# Patient Record
Sex: Female | Born: 1940
Health system: Southern US, Community
[De-identification: ages and names within clinical notes are randomized; demographics above are authoritative.]

## PROBLEM LIST (undated history)

## (undated) DIAGNOSIS — E7800 Pure hypercholesterolemia, unspecified: Secondary | ICD-10-CM

## (undated) DIAGNOSIS — K5790 Diverticulosis of intestine, part unspecified, without perforation or abscess without bleeding: Secondary | ICD-10-CM

## (undated) DIAGNOSIS — K635 Polyp of colon: Secondary | ICD-10-CM

## (undated) DIAGNOSIS — K219 Gastro-esophageal reflux disease without esophagitis: Secondary | ICD-10-CM

## (undated) DIAGNOSIS — E785 Hyperlipidemia, unspecified: Secondary | ICD-10-CM

## (undated) DIAGNOSIS — R011 Cardiac murmur, unspecified: Secondary | ICD-10-CM

## (undated) HISTORY — PX: TOE SURGERY: SHX1073

## (undated) HISTORY — PX: KNEE SURGERY: SHX244

## (undated) HISTORY — DX: Polyp of colon: K63.5

## (undated) HISTORY — PX: CHOLECYSTECTOMY: SHX55

## (undated) HISTORY — DX: Hyperlipidemia, unspecified: E78.5

## (undated) HISTORY — DX: Diverticulosis of intestine, part unspecified, without perforation or abscess without bleeding: K57.90

## (undated) HISTORY — PX: PARTIAL HYSTERECTOMY: SHX80

## (undated) HISTORY — PX: COLONOSCOPY: SHX174

## (undated) HISTORY — PX: ABDOMINAL HYSTERECTOMY: SHX81

---

## 2004-03-10 ENCOUNTER — Ambulatory Visit (HOSPITAL_COMMUNITY): Admission: RE | Admit: 2004-03-10 | Discharge: 2004-03-10 | Payer: Self-pay | Admitting: Internal Medicine

## 2004-06-14 ENCOUNTER — Ambulatory Visit (HOSPITAL_COMMUNITY): Admission: RE | Admit: 2004-06-14 | Discharge: 2004-06-14 | Payer: Self-pay | Admitting: Pulmonary Disease

## 2005-03-13 ENCOUNTER — Ambulatory Visit (HOSPITAL_COMMUNITY): Admission: RE | Admit: 2005-03-13 | Discharge: 2005-03-13 | Payer: Self-pay | Admitting: Internal Medicine

## 2009-10-17 ENCOUNTER — Ambulatory Visit (HOSPITAL_COMMUNITY): Admission: RE | Admit: 2009-10-17 | Discharge: 2009-10-17 | Payer: Self-pay | Admitting: Internal Medicine

## 2010-01-05 ENCOUNTER — Encounter: Payer: Self-pay | Admitting: Orthopedic Surgery

## 2010-02-03 ENCOUNTER — Encounter: Payer: Self-pay | Admitting: Orthopedic Surgery

## 2010-04-21 ENCOUNTER — Ambulatory Visit (HOSPITAL_COMMUNITY): Admission: RE | Admit: 2010-04-21 | Discharge: 2010-04-21 | Payer: Self-pay | Admitting: Internal Medicine

## 2010-12-15 LAB — COMPREHENSIVE METABOLIC PANEL
ALT: 17 U/L (ref 7–35)
Albumin: 4.6
Calcium: 9.6 mg/dL
Total Bilirubin: 0.4 mg/dL
Total Protein: 6.8 g/dL

## 2010-12-15 LAB — CBC: WBC: 8.9

## 2010-12-26 ENCOUNTER — Ambulatory Visit (HOSPITAL_COMMUNITY): Payer: Medicare Other

## 2010-12-26 ENCOUNTER — Ambulatory Visit (HOSPITAL_COMMUNITY)
Admission: RE | Admit: 2010-12-26 | Discharge: 2010-12-26 | Disposition: A | Discharge status: Home / Self Care | Payer: Medicare Other | Source: Ambulatory Visit | Attending: Internal Medicine | Admitting: Internal Medicine

## 2010-12-26 ENCOUNTER — Other Ambulatory Visit (HOSPITAL_COMMUNITY): Payer: Self-pay | Admitting: Internal Medicine

## 2010-12-26 DIAGNOSIS — M25559 Pain in unspecified hip: Secondary | ICD-10-CM | POA: Insufficient documentation

## 2011-08-22 ENCOUNTER — Ambulatory Visit (INDEPENDENT_AMBULATORY_CARE_PROVIDER_SITE_OTHER): Payer: Medicare Other | Admitting: Gastroenterology

## 2011-08-22 ENCOUNTER — Encounter: Payer: Self-pay | Admitting: Gastroenterology

## 2011-08-22 ENCOUNTER — Other Ambulatory Visit: Payer: Self-pay | Admitting: Gastroenterology

## 2011-08-22 VITALS — BP 123/75 | HR 65 | Temp 97.0°F | Ht 62.0 in | Wt 154.2 lb

## 2011-08-22 DIAGNOSIS — J069 Acute upper respiratory infection, unspecified: Secondary | ICD-10-CM

## 2011-08-22 DIAGNOSIS — Z860100 Personal history of colon polyps, unspecified: Secondary | ICD-10-CM

## 2011-08-22 DIAGNOSIS — R140 Abdominal distension (gaseous): Secondary | ICD-10-CM

## 2011-08-22 DIAGNOSIS — R1084 Generalized abdominal pain: Secondary | ICD-10-CM

## 2011-08-22 DIAGNOSIS — R143 Flatulence: Secondary | ICD-10-CM

## 2011-08-22 DIAGNOSIS — Z800 Family history of malignant neoplasm of digestive organs: Secondary | ICD-10-CM

## 2011-08-22 DIAGNOSIS — R109 Unspecified abdominal pain: Secondary | ICD-10-CM

## 2011-08-22 DIAGNOSIS — R141 Gas pain: Secondary | ICD-10-CM

## 2011-08-22 DIAGNOSIS — K219 Gastro-esophageal reflux disease without esophagitis: Secondary | ICD-10-CM

## 2011-08-22 MED ORDER — ALIGN 4 MG PO CAPS: 4.0000 mg | ORAL_CAPSULE | Freq: Every day | ORAL | Status: DC

## 2011-08-22 MED ORDER — AZITHROMYCIN 250 MG PO TABS: ORAL_TABLET | ORAL | Status: DC

## 2011-08-22 MED ORDER — PEG-KCL-NACL-NASULF-NA ASC-C 100 G PO SOLR: 1.0000 | Freq: Once | ORAL | Status: DC

## 2011-08-22 NOTE — Progress Notes (Signed)
Primary Care Physician:  Carylon Perches, MD  Primary Gastroenterologist:  Roetta Sessions, MD   Chief Complaint  Patient presents with  . Colonoscopy    HPI:  Brandy Lamb is a 70 y.o. female here to schedule colonoscopy. She gives a history of colon polyps removed more than 6 years ago by Dr. Aleene Davidson in [place], [place]. There is a questionable family history of colon cancer. She states her father had surgery for diverticulitis that she thinks that he had colon cancer. She denies constipation. Bowel movement 1-2 times daily.  Abdominal bloating all day, gets worse in evening. Can be bad without eating as well. Complains bulge in upper abdomen, in the right upper quadrant and just below the umbilicus. No melena, brbpr. Some abd pain just below umbilicus. Sometimes pain worse with meals. No n/v. No heartburn on lansoprazole. Started lansoprazole couple years ago. No dysphagia.  Current Outpatient Prescriptions  Medication Sig Dispense Refill  . cholecalciferol (VITAMIN D) 1000 UNITS tablet Take 1,000 Units by mouth daily.        Marland Kitchen co-enzyme Q-10 30 MG capsule Take 30 mg by mouth 3 (three) times daily.        Marland Kitchen estrogens, conjugated, (PREMARIN) 0.3 MG tablet Take 0.3 mg by mouth daily. Take daily for 21 days then do not take for 7 days.       . fish oil-omega-3 fatty acids 1000 MG capsule Take 2 g by mouth daily.        . lansoprazole (PREVACID) 15 MG capsule Take 15 mg by mouth daily.        . rosuvastatin (CRESTOR) 5 MG tablet Take 5 mg by mouth daily.        . vitamin B-12 (CYANOCOBALAMIN) 1000 MCG tablet Take 1,000 mcg by mouth daily.          Allergies as of 08/22/2011  . (No Known Allergies)    Past Medical History  Diagnosis Date  . Hyperlipidemia     Past Surgical History  Procedure Date  . Toe surgery   . Partial hysterectomy   . Cholecystectomy   . Colonoscopy     polyps, Dr. Aleene Davidson. ?5-6 years ago.  . Knee surgery     left    Family History  Problem Relation  Age of Onset  . GI problems Father     diverticulitis surgery, no sure if had colon cancer  . Liver disease Neg Hx   . Heart disease Mother 83    deceased    History   Social History  . Marital Status: Married    Spouse Name: N/A    Number of Children: 2  . Years of Education: N/A   Occupational History  . retired    Social History Main Topics  . Smoking status: Former Smoker -- 1.0 packs/day    Types: Cigarettes  . Smokeless tobacco: Not on file   Comment: quit about 20+ yrs  . Alcohol Use: Yes     once in a blue moon  . Drug Use: No  . Sexually Active: Not on file   Other Topics Concern  . Not on file   Social History Narrative  . No narrative on file      ROS:  General: Negative for anorexia, weight loss, fever, chills, fatigue, weakness. Eyes: Negative for vision changes.  ENT: Negative for hoarseness, difficulty swallowing , nasal congestion. CV: Negative for chest pain, angina, palpitations, dyspnea on exertion, peripheral edema.  Respiratory: Negative for dyspnea at rest,  dyspnea on exertion complains of cough and yellow sputum for one week, no wheezing.  GI: See history of present illness. GU:  Negative for dysuria, hematuria, urinary incontinence, urinary frequency, nocturnal urination.  MS: Negative for joint pain, low back pain.  Derm: Negative for rash or itching.  Neuro: Negative for weakness, abnormal sensation, seizure, frequent headaches, memory loss, confusion.  Psych: Negative for anxiety, depression, suicidal ideation, hallucinations.  Endo: Negative for unusual weight change.  Heme: Negative for bruising or bleeding. Allergy: Negative for rash or hives.    Physical Examination:  BP 123/75  Pulse 65  Temp(Src) 97 F (36.1 C) (Temporal)  Ht 5\' 2"  (1.575 m)  Wt 154 lb 3.2 oz (69.945 kg)  BMI 28.20 kg/m2   General: Well-nourished, well-developed in no acute distress.  Head: Normocephalic, atraumatic.   Eyes: Conjunctiva pink, no  icterus. Mouth: Oropharyngeal mucosa moist and pink , no lesions. Slight erythema. No exudate. Neck: Supple without thyromegaly, masses, or lymphadenopathy.  Lungs: Clear to auscultation bilaterally.  Heart: Regular rate and rhythm, no murmurs rubs or gallops.  Abdomen: Bowel sounds are normal, nontender, nondistended, no hepatosplenomegaly or masses, no abdominal bruits or hernia , no rebound or guarding.   Rectal: Defer to time of colonoscopy. Extremities: No lower extremity edema. No clubbing or deformities.  Neuro: Alert and oriented x 4 , grossly normal neurologically.  Skin: Warm and dry, no rash or jaundice.   Psych: Alert and cooperative, normal mood and affect.  Labs: 12/15/2010, white blood cell count 8900, hemoglobin 15.1, hematocrit 45.3, MCV 99.6, platelets 264,000, BUN 10, creatinine 0.81, glucose 101, total bilirubin 0.4, alkaline phosphatase 75, AST 18, ALT 17, albumin 4.6.  Imaging Studies: No results found.

## 2011-08-22 NOTE — Progress Notes (Signed)
Cc to PCP 

## 2011-08-22 NOTE — Assessment & Plan Note (Addendum)
Trial of align one daily. After colonoscopy, based on findings and ongoing symptoms, consider CT of the abdomen and pelvis for further evaluation of her bloating and abdominal pain. Dr. Jena Gauss to decide after TCS complete.

## 2011-08-22 NOTE — Assessment & Plan Note (Signed)
Well-controlled on lansoprazole. No alarm symptoms.

## 2011-08-22 NOTE — Assessment & Plan Note (Signed)
C/O productive sputum. Lungs clear. Zpak. If no better, call Dr. Ouida Sills.

## 2011-08-22 NOTE — Patient Instructions (Addendum)
We have scheduled you for a colonoscopy with Dr. Jena Gauss. Please see separate instructions. I have sent a prescription for Z-Pak to [facility] for your upper respiratory infection. Try Align one daily for abdominal bloating. I have provided you with 2 weeks of samples. You may buy over the counter. Take for six weeks total.   Bloating Bloating is the feeling of fullness in your belly. You may feel as though your pants are too tight. Often the cause of bloating is overeating, retaining fluids, or having gas in your bowel. It is also caused by swallowing air and eating foods that cause gas. Irritable bowel syndrome is one of the most common causes of bloating. Constipation is also a common cause. Sometimes more serious problems can cause bloating. SYMPTOMS Usually there is a feeling of fullness, as though your abdomen is bulged out. There may be mild discomfort.  DIAGNOSIS Usually no particular testing is necessary for most bloating. If the condition persists and seems to become worse, your caregiver may do additional testing.  TREATMENT  There is no direct treatment for bloating.   Do not put gas into the bowel. Avoid chewing gum and sucking on candy. These tend to make you swallow air. Swallowing air can also be a nervous habit. Try to avoid this.   Avoiding high residue diets will help. Eat foods with soluble fibers (examples include, root vegetables, apples, barley) and substitute dairy products with soy and rice products. This helps irritable bowel syndrome.   If constipation is the cause, then a high residue diet with more fiber will help.   Avoid carbonated beverages.   Over the counter preparations are available that help reduce gas. Your pharmacist can help you with this.  SEEK MEDICAL CARE IF:  Bloating continues and seems to be getting worse.   You notice a weight gain.   You have a weight loss but the bloating is getting worse.   You have changes in your bowel  habits or develop nausea or vomiting.  SEEK IMMEDIATE MEDICAL CARE IF:  You develop shortness of breath or swelling in your legs.   You have an increase in abdominal pain or develop chest pain.  Document Released: 08/22/2006 Document Re-Released: 11/13/2009 [facility] Patient Information 2011 [place], [place].

## 2011-08-22 NOTE — Assessment & Plan Note (Signed)
Due for surveillance colonoscopy. Also with abdominal bloating, generalized vague abdominal pain with no association with bowel movements. Questionable family history of colon cancer in her father. These details are very vague as well. Proceed with colonoscopy for surveillance purposes. Patient had numerous concerns as she recently had a sister-in-law who died after perforated colon related to colonoscopy up in [place], [place].  I have discussed the risks, alternatives, benefits with regards to but not limited to the risk of reaction to medication, bleeding, infection, perforation and the patient is agreeable to proceed. Written consent to be obtained.

## 2011-09-10 ENCOUNTER — Encounter (HOSPITAL_COMMUNITY): Payer: Self-pay | Admitting: Pharmacy Technician

## 2011-09-18 MED ORDER — SODIUM CHLORIDE 0.45 % IV SOLN
Freq: Once | INTRAVENOUS | Status: AC
  Administered 2011-09-19: 07:00:00 via INTRAVENOUS

## 2011-09-19 ENCOUNTER — Encounter (HOSPITAL_COMMUNITY)
Admission: RE | Disposition: A | Discharge status: Home / Self Care | Payer: Self-pay | Source: Ambulatory Visit | Attending: Internal Medicine

## 2011-09-19 ENCOUNTER — Encounter (HOSPITAL_COMMUNITY): Payer: Self-pay

## 2011-09-19 ENCOUNTER — Ambulatory Visit (HOSPITAL_COMMUNITY)
Admission: RE | Admit: 2011-09-19 | Discharge: 2011-09-19 | Disposition: A | Discharge status: Home / Self Care | Payer: Medicare Other | Source: Ambulatory Visit | Attending: Internal Medicine | Admitting: Internal Medicine

## 2011-09-19 DIAGNOSIS — Z86010 Personal history of colonic polyps: Secondary | ICD-10-CM

## 2011-09-19 DIAGNOSIS — R109 Unspecified abdominal pain: Secondary | ICD-10-CM | POA: Insufficient documentation

## 2011-09-19 DIAGNOSIS — Z800 Family history of malignant neoplasm of digestive organs: Secondary | ICD-10-CM

## 2011-09-19 DIAGNOSIS — K5730 Diverticulosis of large intestine without perforation or abscess without bleeding: Secondary | ICD-10-CM | POA: Insufficient documentation

## 2011-09-19 DIAGNOSIS — R140 Abdominal distension (gaseous): Secondary | ICD-10-CM

## 2011-09-19 DIAGNOSIS — K219 Gastro-esophageal reflux disease without esophagitis: Secondary | ICD-10-CM

## 2011-09-19 DIAGNOSIS — J069 Acute upper respiratory infection, unspecified: Secondary | ICD-10-CM

## 2011-09-19 DIAGNOSIS — E785 Hyperlipidemia, unspecified: Secondary | ICD-10-CM | POA: Insufficient documentation

## 2011-09-19 HISTORY — DX: Cardiac murmur, unspecified: R01.1

## 2011-09-19 HISTORY — DX: Gastro-esophageal reflux disease without esophagitis: K21.9

## 2011-09-19 HISTORY — DX: Pure hypercholesterolemia, unspecified: E78.00

## 2011-09-19 HISTORY — PX: COLONOSCOPY: SHX5424

## 2011-09-19 SURGERY — COLONOSCOPY
Anesthesia: Moderate Sedation

## 2011-09-19 MED ORDER — MIDAZOLAM HCL 5 MG/5ML IJ SOLN
INTRAMUSCULAR | Status: AC
  Filled 2011-09-19: qty 10

## 2011-09-19 MED ORDER — MEPERIDINE HCL 100 MG/ML IJ SOLN
INTRAMUSCULAR | Status: AC
  Filled 2011-09-19: qty 2

## 2011-09-19 MED ORDER — MEPERIDINE HCL 100 MG/ML IJ SOLN
INTRAMUSCULAR | Status: DC | PRN
  Administered 2011-09-19: 50 mg via INTRAVENOUS

## 2011-09-19 MED ORDER — MIDAZOLAM HCL 5 MG/5ML IJ SOLN
INTRAMUSCULAR | Status: DC | PRN
  Administered 2011-09-19: 1 mg via INTRAVENOUS
  Administered 2011-09-19: 2 mg via INTRAVENOUS

## 2011-09-19 NOTE — H&P (Signed)
  I have seen & examined the patient prior to the procedure(s) today and reviewed the history and physical/consultation.  There have been no changes.  After consideration of the risks, benefits, alternatives and imponderables, the patient has consented to the procedure(s).   

## 2011-09-21 ENCOUNTER — Other Ambulatory Visit: Payer: Self-pay | Admitting: Gastroenterology

## 2011-09-21 ENCOUNTER — Ambulatory Visit (HOSPITAL_COMMUNITY)
Admit: 2011-09-21 | Discharge: 2011-09-21 | Disposition: A | Discharge status: Home / Self Care | Payer: Medicare Other | Source: Ambulatory Visit | Attending: Internal Medicine | Admitting: Internal Medicine

## 2011-09-21 DIAGNOSIS — K5730 Diverticulosis of large intestine without perforation or abscess without bleeding: Secondary | ICD-10-CM | POA: Insufficient documentation

## 2011-09-21 DIAGNOSIS — R109 Unspecified abdominal pain: Secondary | ICD-10-CM | POA: Insufficient documentation

## 2011-09-21 DIAGNOSIS — Z9889 Other specified postprocedural states: Secondary | ICD-10-CM | POA: Insufficient documentation

## 2011-09-21 DIAGNOSIS — R1900 Intra-abdominal and pelvic swelling, mass and lump, unspecified site: Secondary | ICD-10-CM | POA: Insufficient documentation

## 2011-09-21 MED ORDER — IOHEXOL 300 MG/ML  SOLN
100.0000 mL | Freq: Once | INTRAMUSCULAR | Status: AC | PRN
  Administered 2011-09-21: 100 mL via INTRAVENOUS

## 2011-09-24 DIAGNOSIS — K5730 Diverticulosis of large intestine without perforation or abscess without bleeding: Secondary | ICD-10-CM

## 2011-09-24 DIAGNOSIS — R109 Unspecified abdominal pain: Secondary | ICD-10-CM

## 2011-09-24 DIAGNOSIS — Z86010 Personal history of colonic polyps: Secondary | ICD-10-CM

## 2011-09-25 ENCOUNTER — Encounter: Payer: Self-pay | Admitting: Internal Medicine

## 2011-09-26 ENCOUNTER — Encounter (HOSPITAL_COMMUNITY): Payer: Self-pay | Admitting: Internal Medicine

## 2011-10-04 ENCOUNTER — Telehealth: Payer: Self-pay | Admitting: Gastroenterology

## 2011-10-04 NOTE — Telephone Encounter (Signed)
Routed to RMR 

## 2011-10-04 NOTE — Telephone Encounter (Signed)
Pt called wanting her procedure and CT results- she can be reached on her cell [phone]

## 2011-10-05 ENCOUNTER — Encounter: Payer: Self-pay | Admitting: Internal Medicine

## 2011-10-05 ENCOUNTER — Telehealth: Payer: Self-pay | Admitting: Internal Medicine

## 2011-10-05 NOTE — Telephone Encounter (Signed)
Took call from from front desk- Pt stated she had an "infection in her nose and that it was from her procedure two weeks ago- I asked her to explain what she meant by an infection- pt stated her nose was red and hurting inside, but she denied any discharge and then she asked "what are you going to do about it"- I asked pt if she had seen her PCP- Brandy Lamb said she has spoken to Brandy Lamb and he told her to call us and let us "handle it"- I advised pt that I would type up a message for the nurse and Brandy Lamb- she kept saying "so that means you wont help me"- I explained to her that our new policy was to type messages and send them to the nurse but we would help her best we could- Pt still was very upset and hung up

## 2011-10-05 NOTE — OR Nursing (Signed)
Called and spoke with patient and told her that I had talked with Dr. Jena Gauss and they would be calling her in the few hours with her results. She said, "well if they don't call, I'm not worried about it. I told her that they would speak with her today.

## 2011-10-05 NOTE — Progress Notes (Signed)
See letter I have sent to patient

## 2011-10-05 NOTE — Progress Notes (Signed)
Was asked by Dr. Jena Gauss to call pt with her results of her CT and colonoscopy (SEE HIS PROGRESS NOTE OF 10/05/2011). I called and a female answered the phone . When I asked to speak to pt, he said did you just call, and I said No sir, may I speak with the pt. He said she is not here and hung up. I informed Dr. Jena Gauss of the incident.

## 2011-10-05 NOTE — Telephone Encounter (Signed)
Pt called this morning saying she had talked with her PCP and he said for her to call us to handle this situation. Pt said she had a procedure and now has an infection and what were we going to do about it. She is complaining of her nose hurting and being red. Stated it could be from the O2 she received while at hospital. I explained to patient that a phone note was taken yesterday and RMR was seeing patients this morning, but would address her concerns afterwards.  She didn't want me to have nurse call her back because she said we would not call. She said she has been waiting for 2 weeks and no one would call her. As patient started getting more frustrated I transferred call over to [place].

## 2011-10-05 NOTE — Telephone Encounter (Signed)
I have reviewed the complaint. Needs to be addressed by short stay nursing staff. I called Hollie Salk RN. She stated she will call the patient

## 2011-10-05 NOTE — OR Nursing (Signed)
Called and spoke with patient upon request from Dr. Jena Gauss. Patient states, "when I left the hospital that day, I was sneezing, and my nose was dripping." When asked what color it was dripping, she said that she didn't know. She stated, "it's so sore." I explained that her oxygen was on for a total of 30 minutes during the procedure. Patient states, that "oxygen didn't smell right or feel right when they put it on." I explained that Dr. Jena Gauss didn't deal with nose infections but I would be glad to call Dr. Alonza Smoker office and explain her situation and get her an appointment. Patient refused and stated, "Don't even bother with it. If nobody wants to do anything about it then I'll deal with it." Patient proceeded to state, that she had her procedure on November 14th and hadn't heard anything about it and had a CT scan on the 16th and still had not received those results either. I told the patient that I would call Dr.  Jena Gauss and check on those results. Patient stated, "Don't even bother with it because I don't intend on coming back to that hospital again." Explained to patient that I understood her frustration with having to wait for her results and would be more than happy to call Dr. Jena Gauss and she responded, "well, if you don't have time then don't worry about it." I told her that I would call and then would get back in touch with her. I called Dr. Luvenia Starch office and spoke with Aggie Cosier who said that Dr. Jena Gauss was seeing a patient and would have him call me back. Called patient and told her I would call her as soon as I heard back from Dr. Jena Gauss and she stated, "Well, don't worry about it because I know he's busy."  Will follow-up with patient after speaking with Dr. Jena Gauss.

## 2011-10-05 NOTE — Progress Notes (Signed)
Called pt. Female answered the phone. He asked if I had just called previously. I told him no, and I asked to speak to pt and he said " she is not here" and hung up.

## 2011-10-05 NOTE — Progress Notes (Unsigned)
Patient ID: LILIANN FILE, female   DOB: 01/07/1941, 70 y.o.   MRN: [id number] Patient has called upset about a nasal infection she states she got from hospital at the time of colonoscopy. Colonoscopy unremarkable except for diverticulosis. Because of abdominal pain she underwent a CT of the abdomen and pelvis which, again, demonstrated diverticulosis only. Please let patient know diverticulosis only on both colonoscopy and CT. No polyps. Recommended daily fiber supplement and office followup in a few weeks with extender.

## 2011-10-08 NOTE — Progress Notes (Signed)
Dr. Jena Gauss  Was made aware on Friday.

## 2011-10-09 ENCOUNTER — Encounter: Payer: Self-pay | Admitting: Internal Medicine

## 2011-10-09 ENCOUNTER — Telehealth: Payer: Self-pay

## 2011-10-09 NOTE — Telephone Encounter (Signed)
Per Soledad Gerlach, she called the pt to schedule follow up appt  Per Dr,. Rourk's recommendation in his letter dictated on 10/05/2011. Pt had questions and said she had not heard from any results. I spoke with pt and told her that Dr. Jena Gauss had a letter to her. She is not home, and doesn't know when she will be. I went over the results listed in the letter, she was hesitant to schedule appt and wanted to know what will be done for her if she comes in for OV. I told her extender will do an assessment and discuss how she has been doing, and there is no way to say what will be done til she comes in and has the assessment. She then said she will just call back when she finds time to schedule.

## 2015-02-04 ENCOUNTER — Emergency Department (HOSPITAL_COMMUNITY)
Admission: EM | Admit: 2015-02-04 | Discharge: 2015-02-04 | Disposition: A | Discharge status: Home / Self Care | Payer: Medicare Other | Attending: Emergency Medicine | Admitting: Emergency Medicine

## 2015-02-04 ENCOUNTER — Emergency Department (HOSPITAL_COMMUNITY): Payer: Medicare Other

## 2015-02-04 ENCOUNTER — Encounter (HOSPITAL_COMMUNITY): Payer: Self-pay

## 2015-02-04 DIAGNOSIS — M25531 Pain in right wrist: Secondary | ICD-10-CM | POA: Diagnosis not present

## 2015-02-04 DIAGNOSIS — M25561 Pain in right knee: Secondary | ICD-10-CM

## 2015-02-04 DIAGNOSIS — Z79899 Other long term (current) drug therapy: Secondary | ICD-10-CM | POA: Diagnosis not present

## 2015-02-04 DIAGNOSIS — S99912A Unspecified injury of left ankle, initial encounter: Secondary | ICD-10-CM | POA: Diagnosis not present

## 2015-02-04 DIAGNOSIS — W010XXA Fall on same level from slipping, tripping and stumbling without subsequent striking against object, initial encounter: Secondary | ICD-10-CM | POA: Diagnosis not present

## 2015-02-04 DIAGNOSIS — M25572 Pain in left ankle and joints of left foot: Secondary | ICD-10-CM

## 2015-02-04 DIAGNOSIS — K219 Gastro-esophageal reflux disease without esophagitis: Secondary | ICD-10-CM | POA: Insufficient documentation

## 2015-02-04 DIAGNOSIS — Y998 Other external cause status: Secondary | ICD-10-CM | POA: Diagnosis not present

## 2015-02-04 DIAGNOSIS — W19XXXA Unspecified fall, initial encounter: Secondary | ICD-10-CM

## 2015-02-04 DIAGNOSIS — S8991XA Unspecified injury of right lower leg, initial encounter: Secondary | ICD-10-CM | POA: Diagnosis not present

## 2015-02-04 DIAGNOSIS — R011 Cardiac murmur, unspecified: Secondary | ICD-10-CM | POA: Diagnosis not present

## 2015-02-04 DIAGNOSIS — Y9289 Other specified places as the place of occurrence of the external cause: Secondary | ICD-10-CM | POA: Insufficient documentation

## 2015-02-04 DIAGNOSIS — S62114A Nondisplaced fracture of triquetrum [cuneiform] bone, right wrist, initial encounter for closed fracture: Secondary | ICD-10-CM | POA: Diagnosis not present

## 2015-02-04 DIAGNOSIS — E785 Hyperlipidemia, unspecified: Secondary | ICD-10-CM | POA: Diagnosis not present

## 2015-02-04 DIAGNOSIS — Y9301 Activity, walking, marching and hiking: Secondary | ICD-10-CM | POA: Insufficient documentation

## 2015-02-04 DIAGNOSIS — Z87891 Personal history of nicotine dependence: Secondary | ICD-10-CM | POA: Insufficient documentation

## 2015-02-04 DIAGNOSIS — S6991XA Unspecified injury of right wrist, hand and finger(s), initial encounter: Secondary | ICD-10-CM | POA: Diagnosis present

## 2015-02-04 DIAGNOSIS — S62111A Displaced fracture of triquetrum [cuneiform] bone, right wrist, initial encounter for closed fracture: Secondary | ICD-10-CM

## 2015-02-04 MED ORDER — HYDROCODONE-ACETAMINOPHEN 5-325 MG PO TABS: ORAL_TABLET | ORAL | Status: DC

## 2015-02-04 NOTE — ED Provider Notes (Signed)
CSN: [id number]     Arrival date & time 02/04/15  1609 History   First MD Initiated Contact with Patient 02/04/15 1643     Chief Complaint  Patient presents with  . Fall      HPI Pt was seen at 1650. Per pt, c/o sudden onset and resolution of one episode of slip and fall that occurred this morning at 1130am. Pt states she was walking and "tripped over a strap on a bag" this morning, and fell on the floor. Pt states she has been able to ambulate since the fall.  Pt c/o right hand, wrist, knee pain and left ankle pain. Right wrist pain is concerning pt "more than the other pains" and "I hope it's not broken." Right wrist pain worsens with palpation of the area and right wrist ROM. Pt took an OTC motrin 248m tab PTA with improvement of her pain. Denies prodromal symptoms before falling. Denies LOC, no AMS, no neck or back pain, no CP/SOB, no abd pain, no N/V/D, no focal motor weakness, no tinging/numbness in extremities.    Past Medical History  Diagnosis Date  . Hyperlipidemia   . Hypercholesteremia   . Heart murmur   . GERD (gastroesophageal reflux disease)    Past Surgical History  Procedure Laterality Date  . Toe surgery      2nd toe right foot  . Partial hysterectomy    . Cholecystectomy    . Colonoscopy      polyps, Dr. SWest Carbo ?5-6 years ago.  . Knee surgery      left  . Abdominal hysterectomy    . Colonoscopy  09/19/2011    Procedure: COLONOSCOPY;  Surgeon: RDaneil Dolin MD;  [facility];  Service: Endoscopy;  Laterality: N/A;  7:30   Family History  Problem Relation Age of Onset  . GI problems Father     diverticulitis surgery, not sure but she thinks he had colon cancer  . Liver disease Neg Hx   . Anesthesia problems Neg Hx   . Hypotension Neg Hx   . Malignant hyperthermia Neg Hx   . Pseudochol deficiency Neg Hx   . Heart disease Mother 526   deceased   History  Substance Use Topics  . Smoking status: Former Smoker -- 1.00 packs/day for 10 years     Types: Cigarettes    Quit date: 09/18/2005  . Smokeless tobacco: Not on file     Comment: quit about 20+ yrs  . Alcohol Use: Yes     Comment: once in a blue moon    Review of Systems ROS: Statement: All systems negative except as marked or noted in the HPI; Constitutional: Negative for fever and chills. ; ; Eyes: Negative for eye pain, redness and discharge. ; ; ENMT: Negative for ear pain, hoarseness, nasal congestion, sinus pressure and sore throat. ; ; Cardiovascular: Negative for chest pain, palpitations, diaphoresis, dyspnea and peripheral edema. ; ; Respiratory: Negative for cough, wheezing and stridor. ; ; Gastrointestinal: Negative for nausea, vomiting, diarrhea, abdominal pain, blood in stool, hematemesis, jaundice and rectal bleeding. . ; ; Genitourinary: Negative for dysuria, flank pain and hematuria. ; ; Musculoskeletal: +right wrist, hand pain; +right knee pain; +left ankle pain. Negative for back pain and neck pain. Negative for deformity.; ; Skin: Negative for pruritus, rash, abrasions, blisters, bruising and skin lesion.; ; Neuro: Negative for headache, lightheadedness and neck stiffness. Negative for weakness, altered level of consciousness , altered mental status, extremity weakness, paresthesias, involuntary  movement, seizure and syncope.      Allergies  Levaquin  Home Medications   Prior to Admission medications   Medication Sig Start Date End Date Taking? Authorizing Provider  Calcium Carbonate-Vit D-Min (CALCIUM 1200 PO) Take 1 tablet by mouth daily.     Yes Historical Provider, MD  cholecalciferol (VITAMIN D) 1000 UNITS tablet Take 1,000 Units by mouth daily.     Yes Historical Provider, MD  Coenzyme Q10 (CO Q-10) 100 MG CAPS Take 1 capsule by mouth daily.     Yes Historical Provider, MD  fish oil-omega-3 fatty acids 1000 MG capsule Take 1 g by mouth daily.    Yes Historical Provider, MD  Garlic 1610 MG CAPS Take 1 capsule by mouth daily.     Yes Historical  Provider, MD  lansoprazole (PREVACID) 15 MG capsule Take 15 mg by mouth daily.     Yes Historical Provider, MD  Multiple Vitamin (ANTIOXIDANT A/C/E PO) Take 1 tablet by mouth daily.     Yes Historical Provider, MD  vitamin B-12 (CYANOCOBALAMIN) 1000 MCG tablet Take 1,000 mcg by mouth daily.     Yes Historical Provider, MD  peg 3350 powder (MOVIPREP) 100 G SOLR Take 1 kit (100 g total) by mouth once. As directed Please purchase 1 Fleets enema to use with the prep Patient not taking: Reported on 02/04/2015 08/22/11   Daneil Dolin, MD   BP 109/68 mmHg  Pulse 74  Temp(Src) 98.7 F (37.1 C) (Oral)  Resp 18  Ht 5' 1"  (1.549 m)  Wt 140 lb (63.504 kg)  BMI 26.47 kg/m2  SpO2 98% Physical Exam  1655: Physical examination: Vital signs and O2 SAT: Reviewed; Constitutional: Well developed, Well nourished, Well hydrated, In no acute distress; Head and Face: Normocephalic, Atraumatic; Eyes: EOMI, PERRL, No scleral icterus; ENMT: Mouth and pharynx normal, Left TM normal, Right TM normal, Mucous membranes moist; Neck: Supple, Trachea midline; Spine:  No midline CS, TS, LS tenderness.; Cardiovascular: Regular rate and rhythm, No gallop; Respiratory: Breath sounds clear & equal bilaterally, No wheezes, Normal respiratory effort/excursion; Chest: Nontender, No deformity, Movement normal, No crepitus, No abrasions or ecchymosis.; Abdomen: Soft, Nontender, Nondistended, Normal bowel sounds, No abrasions or ecchymosis.; Genitourinary: No CVA tenderness;; Extremities: No deformity, +right wrist with generalized TTP. No snuffbox tenderness. Forearm compartments soft, strong radial pp, brisk cap refill in fingers. Hand NMS intact.  Decreased ROM F/E right wrist d/t c/o pain. Right hand TTP along 3rd MCP. No open wounds, no ecchymosis, no edema, no erythema, no deformity. NT right shoulder/elbow/fingers. NT left ankle. NMS left foot, strong pedal pp, LE muscle compartments soft.  No left proximal fibular head tenderness, no  knee tenderness, no foot tenderness.  No deformity, no ecchymosis, no erythema, no edema, no open wounds.  +plantarflexion of leftt foot w/calf squeeze.  No palpable gap left Achilles's tendon. +FROM right knee, including able to lift extended RLE against gravity, and extend right lower leg against resistance.  No ligamentous laxity.  No patellar or quad tendon step-offs.  NMS intact right foot, strong pedal pp. +plantarflexion of right foot w/calf squeeze.  No palpable gap right Achilles's tendon.  No proximal fibular head tenderness.  No edema, erythema, warmth, ecchymosis or deformity. NT right knee/ankle/foot. Otherwise, full range of motion major/large joints of bilat UE's and LE's without pain or tenderness to palp, Neurovascularly intact, Pulses normal, Pelvis stable; Neuro: AA&Ox3, GCS 15.  Major CN grossly intact. Speech clear. No gross focal motor or sensory deficits in  extremities. Climbs on and off chair in exam room easily by herself. Gait steady.; Skin: Color normal, Warm, Dry    ED Course  Procedures     EKG Interpretation None      MDM  MDM Reviewed: previous chart, nursing note and vitals Interpretation: x-ray      Dg Wrist Complete Right 02/04/2015   CLINICAL DATA:  Pain after tripping and fall today  EXAM: RIGHT WRIST - COMPLETE 3+ VIEW  COMPARISON:  None.  FINDINGS: There is mild irregularity at the dorsal aspect of the triquetrum which may represent a nondisplaced acute fracture. There are no other areas of suspicion for acute fracture. There is no dislocation. There is mild arthritis at the radial aspect of the wrist. There is no radiopaque foreign body.  IMPRESSION: Probable nondisplaced fracture at the dorsal aspect of the triquetrum.   Electronically Signed   By: Andreas Newport M.D.   On: 02/04/2015 17:49   Dg Ankle Complete Left 02/04/2015   CLINICAL DATA:  Lateral ankle pain after tripping over a bag strap today  EXAM: LEFT ANKLE COMPLETE - 3+ VIEW  COMPARISON:   None.  FINDINGS: There is no evidence of fracture, dislocation, or joint effusion. There is no evidence of arthropathy or other focal bone abnormality. Soft tissues are unremarkable.  IMPRESSION: Negative.   Electronically Signed   By: Andreas Newport M.D.   On: 02/04/2015 17:42   Dg Knee Complete 4 Views Right 02/04/2015   CLINICAL DATA:  Lateral pain after tripping over a bag strap today  EXAM: RIGHT KNEE - COMPLETE 4+ VIEW  COMPARISON:  None.  FINDINGS: There is no evidence of fracture, dislocation, or joint effusion. There is mild degenerative narrowing of the medial compartment. Soft tissues are unremarkable.  IMPRESSION: No acute bony pathology.   Electronically Signed   By: Andreas Newport M.D.   On: 02/04/2015 17:47   Dg Hand Complete Right 02/04/2015   CLINICAL DATA:  Tripped over bag today. Right hand injury and pain. Initial encounter.  EXAM: RIGHT HAND - COMPLETE 3+ VIEW  COMPARISON:  None.  FINDINGS: There is no evidence of fracture or dislocation. Mild osteoarthritis is seen involving interphalangeal joint of thumb. No other significant bone abnormality identified.  IMPRESSION: No acute findings.   Electronically Signed   By: Earle Gell M.D.   On: 02/04/2015 17:51    1800:  Splint right wrist, sling, f/u Ortho MD. Pt wants to go home now. Dx and testing d/w pt.  Questions answered.  Verb understanding, agreeable to d/c home with outpt f/u.   Francine Graven, DO 02/07/15 2035

## 2015-02-04 NOTE — ED Notes (Signed)
C/o rt wrist pain, rt knee and left ankle pain after falling over a strap on a bag today. Denies hitting head.

## 2015-02-04 NOTE — Discharge Instructions (Signed)
°Emergency Department Resource Guide °1) Find a Doctor and Pay Out of Pocket °Although you won't have to find out who is covered by your insurance plan, it is a good idea to ask around and get recommendations. You will then need to call the office and see if the doctor you have chosen will accept you as a new patient and what types of options they offer for patients who are self-pay. Some doctors offer discounts or will set up payment plans for their patients who do not have insurance, but you will need to ask so you aren't surprised when you get to your appointment. ° °2) Contact Your Local Health Department °Not all health departments have doctors that can see patients for sick visits, but many do, so it is worth a call to see if yours does. If you don't know where your local health department is, you can check in your phone book. The CDC also has a tool to help you locate your state's health department, and many state websites also have listings of all of their local health departments. ° °3) Find a Walk-in Clinic °If your illness is not likely to be very severe or complicated, you may want to try a walk in clinic. These are popping up all over the country in pharmacies, drugstores, and shopping centers. They're usually staffed by nurse practitioners or physician assistants that have been trained to treat common illnesses and complaints. They're usually fairly quick and inexpensive. However, if you have serious medical issues or chronic medical problems, these are probably not your best option. ° °No Primary Care Doctor: °- Call [facility] Connect at  832-8000 - they can help you locate a primary care doctor that  accepts your insurance, provides certain services, etc. °- Physician Referral Service- 1-800-533-3463 ° °Chronic Pain Problems: °Organization         Address  Phone   Notes  °[facility]  (336) 297-2271 Patients need to be referred by their primary care doctor.  ° °Medication  Assistance: °Organization         Address  Phone   Notes  °[place] [facility] [address] °[place], [place] [address] (336) 641-8030 --Must be a resident of [place] °-- Must have NO insurance coverage whatsoever (no Medicaid/ Medicare, etc.) °-- The pt. MUST have a primary care doctor that directs their care regularly and follows them in the community °  °[facility]  (866) 331-1348   °[employer]  (888) 892-1162   ° °Agencies that provide inexpensive medical care: °Organization         Address  Phone   Notes  °[facility]  (336) 832-8035   °[facility]    (336) 832-7272   °[facility] [address] °[place], [place] [address] (336) 832-4777   °[facility] [address], °[place] (336) 271-4999   °[facility]    (336) 373-0678   °[facility]    (336) 272-1050   °[facility] ° [address], [place] Phone:  (336) 832-4444, Fax:  (336) 832-4440 Hours of Operation:  9 am - 6 pm, M-F.  Also accepts Medicaid/Medicare and self-pay.  °[facility] ° [address], [place] Phone: (336) 832-3150, Fax: (336) [phone]. Hours of Operation:  8:30 am - 5:30 pm, M-F.  Also accepts Medicaid and self-pay.  °[facility] [place] [address], [place] Phone: (336) 878-6027   °[facility] [address], [place], [place] (336)723-1848, Ext. 123 Mondays & Thursdays: 7-9 AM.  First 15 patients are seen on a first come, first serve basis. °  ° °Medicaid-accepting [place] Providers: ° °Organization         Address  Phone   Notes  °[facility] [address], [place] (336) 641-2100 Also accepts self-pay patients.  °[facility] [address], [place] ° (336) 856-9996   °[facility] [address], [place]  (336) 288-8857   °[facility] [address] [place] [address], [place] (336) 299-7000   °Veita Bland [address], [place]  ° (336) 373-1557 Only accepts [place] Access Medicaid patients after they have their name applied to their card.  ° °Self-Pay (no insurance) in [place]: ° °Organization         Address  Phone   Notes  °Sickle Cell Patients, [facility] [address], [place] (336) 832-1970   °[facility] [address], [place] (336) 832-4400   °[facility] ° [address], [place] (336) 992-4800   °[facility]/Dr. Osei-Bonsu ° [address] [place] [address], [place] or [address], [place] (336) 841-8500 Phone number for both [place] and [place] locations is the same.  °[facility] [address], [place] (336) 299-0000   °[facility] [address] [place] [address], [place] or [address] (336) 852-7530 °(336) 878-2260   °[facility] [address], [place] (336) 350-1642, phone; (336) 294-5005, fax Sees patients 1st and 3rd Saturday of every month.  Must not qualify for public or private insurance (i.e. Medicaid, Medicare, [place] Health Choice, Veterans' Benefits) • Household income should be no more than 200% of the poverty level •The clinic cannot treat you if you are pregnant or think you are pregnant • Sexually transmitted diseases are not treated at the clinic.  ° ° °Dental Care: °Organization         Address  Phone  Notes  °[place] [facility] [address], [place] (336) 641-6152 Accepts children up to age 21 who are enrolled in Medicaid or [place] Health Choice; pregnant women with a Medicaid card; and children who have applied for Medicaid or [place] Health Choice, but were declined, whose parents can pay a reduced fee at time of service.  °[place]  [facility] [place]  [address], [place] (336) 641-7733 Accepts children up to age 21 who are enrolled in Medicaid or [place] Health Choice; pregnant women with a Medicaid card; and children who have applied for Medicaid or [place] Health Choice, but were declined, whose parents can pay a reduced fee at time of service.  °[facility] ° [address], [place] (336) 641-4533 Patients are seen by appointment only. Walk-ins are not accepted. [facility] will see patients 18 years of age and older. °Monday - Tuesday (8am-5pm) °Most Wednesdays (8:30-5pm) °$30 per visit, cash only  °[facility] ° [address], [place] (336) 641-4533 Patients are seen by appointment only. Walk-ins are not accepted. [facility] will see patients 18 years of age and older. °One   Wednesday Evening (Monthly: Volunteer Based).  $30 per visit, cash only  °[facility]  (919) 537-3737 for adults; Children under age 4, call Graduate Pediatric Dentistry at (919) [phone]. Children aged 4-14, please call ([phone]) [phone] to request a pediatric application. ° Dental services are provided in all areas of dental care including fillings, crowns and bridges, complete and partial dentures, implants, gum treatment, root canals, and extractions. Preventive care is also provided. Treatment is provided to both adults and children. °Patients are selected via a lottery and there is often a waiting list. °  °[facility] [address], °[place] ° (336) 763-8833 [email or web address] °  °[facility] [address], [place], [place] (336)723-1848, Ext. 123 Second and Fourth Thursday of each month, opens at 6:30 AM; Clinic ends at 9 AM.  Patients are seen on a first-come first-served basis, and a limited number are seen during each clinic.  ° °[facility] ° [address], [place], [place] (336) 723-7904    Eligibility Requirements °You must have lived in [place], [place], or [place] counties for at least the last three months. °  You cannot be eligible for state or federal sponsored healthcare insurance, including Veterans Administration, Medicaid, or Medicare. °  You generally cannot be eligible for healthcare insurance through your employer.  °  How to apply: °Eligibility screenings are held every Tuesday and Wednesday afternoon from 1:00 pm until 4:00 pm. You do not need an appointment for the interview!  °[facility] [address], [place], [place] 336-631-2330   °[facility]  336-342-8273   °[facility]  336-703-3100   °[facility]  336-570-6415   ° °Behavioral Health Resources in the Community: °Intensive Outpatient Programs °Organization         Address  Phone  Notes  °[facility] [address], [place], [place] 336-878-6098   °[facility] [address], [place], [place] 336-832-9800   °[facility] [address], [place], [place] ° 336-882-2125   °[facility] [address],  °[place], [place] 1-800-853-5163 or 336-641-4981   °Substance Abuse Resources °Organization         Address  Phone  Notes  °[facility]  336-882-2125   °[facility]  336-784-9470   °[facility]  336-285-9073   °[facility]  336-845-3988   °Residential & Outpatient Substance Abuse Program  1-800-659-3381   °Psychological Services °Organization         Address  Phone  Notes  °[facility]  336- 832-9600   °[facility]  336- 378-7881   °[facility] [address], [place] 1-800-853-5163 or 336-641-4981   ° °Mobile Crisis Teams °Organization         Address  Phone  Notes  °[facility], Mobile Crisis Care Unit  1-877-626-1772   °Assertive °[facility] ° [address]  [place], [place] 336-834-9664   °Sharon DeEsch [address] °[place] 336-554-5454   ° °Self-Help/Support Groups °Organization         Address  Phone             Notes  °[facility] of  - variety of support groups  336- 373-1402 Call for more information  °Narcotics Anonymous (NA), [facility] [address], °[place]  2 meetings at this location  ° °  Residential Treatment Programs Organization         Address  Phone  Notes  [facility] [address],    [place]  [phone]   [facility]  [address], [place] [phone], [place], [place] [phone]   [facility] [address], [place] [phone] Admissions: 8am-3pm M-F  [facility] [address]    [place], [place] [phone]   [facility] [address], [place], [place] [phone]   [facility] [address]  [place], [place] [phone]   [facility] Intensive Outpatient [address], [place], [place] [phone]   [facility] ([facility] [address]  [place], [place] [phone] or [phone]   [facility] [address] [place], [place] [phone] Accepts Medicaid  [facility] [address]  [place] [phone] Substance Abuse/Addiction Treatment   [facility] Resources Organization         Address  Phone  Notes  [facility]  239-025-4363(888) [phone]   Angie FavaJulie Brannon, PhD [address], [address] [place], [place]   (630) [phone](336) ([phone] or ([phone](336) [phone]   [facility]   [address] [place], [place] [phone]) [phone]   [facility] [address], [place], [place] [phone](336) [phone] Insurance/Medicaid/sponsorship through [facility]  [facility] [address]                                    [place], [place] [phone]) [phone] Therapy/tele-psych/case    [facility] [address]   [place], [place] [phone]) [phone]    Dr. Lolly MustacheArfeen  [phone]) [phone]   [facility]  [employer] [facility] 1) [address], [place] 2) [address], [place] 3)  [address], [place] [phone](336) [phone](336) [phone]  [phone](336) [phone]   [facility] Child Abuse Hotline [phone](336) [phone] or [phone](336) [phone] (After Hours)      Take the prescription as directed.  If this prescription pain medication is too sedating, take over the counter tylenol and ibuprofen, as directed on packaging, as needed for discomfort. Do not take extra tylenol when you take the prescription medication because it already contains tylenol. Apply moist heat or ice to the area(s) of discomfort, for 15 minutes at a time, several times per day for the next few days.  Do not fall asleep on a heating or ice pack. Wear the splint and use the sling for comfort until you are seen in follow up by the Orthopedic doctor. Elevate your wrist at much as possible. Call the Orthopedic doctor on Monday to schedule a follow up appointment next week.  Return to the Emergency Department immediately if worsening.

## 2015-10-14 ENCOUNTER — Ambulatory Visit (INDEPENDENT_AMBULATORY_CARE_PROVIDER_SITE_OTHER): Payer: Medicare Other | Admitting: Podiatry

## 2015-10-14 ENCOUNTER — Ambulatory Visit (INDEPENDENT_AMBULATORY_CARE_PROVIDER_SITE_OTHER): Payer: Medicare Other

## 2015-10-14 VITALS — BP 135/69 | HR 64 | Resp 16 | Ht 61.0 in | Wt 142.0 lb

## 2015-10-14 DIAGNOSIS — M79672 Pain in left foot: Secondary | ICD-10-CM

## 2015-10-14 DIAGNOSIS — M79671 Pain in right foot: Secondary | ICD-10-CM

## 2015-10-14 DIAGNOSIS — M722 Plantar fascial fibromatosis: Secondary | ICD-10-CM | POA: Diagnosis not present

## 2015-10-14 MED ORDER — TRIAMCINOLONE ACETONIDE 10 MG/ML IJ SUSP
10.0000 mg | Freq: Once | INTRAMUSCULAR | Status: AC
  Administered 2015-10-14: 10 mg

## 2015-10-14 NOTE — Patient Instructions (Signed)

## 2015-10-14 NOTE — Progress Notes (Signed)
   Subjective:    Patient ID: Brandy Lamb, female    DOB: 10/26/1941, 74 y.o.   MRN: [id number]  HPI Patient presents with foot pain in their left foot, heel. Pt stated, "feels like there is a knot in heel"; x3 weeks.   Review of Systems  HENT: Positive for sinus pressure.   Eyes: Positive for visual disturbance.  Endocrine: Positive for cold intolerance.  All other systems reviewed and are negative.      Objective:   Physical Exam        Assessment & Plan:

## 2015-10-16 NOTE — Progress Notes (Signed)
Subjective:     Patient ID: Brandy EnsignLinda B Vinzant, female   DOB: 10/21/1941, 74 y.o.   MRN: [id number]  HPI patient states I feel like there is a not the bottom of my left foot and it's been hurting a while and worse over the last 3 weeks   Review of Systems  All other systems reviewed and are negative.      Objective:   Physical Exam  Constitutional: She is oriented to person, place, and time.  Cardiovascular: Intact distal pulses.   Musculoskeletal: Normal range of motion.  Neurological: She is oriented to person, place, and time.  Skin: Skin is warm.  Nursing note and vitals reviewed.  neurovascular status intact muscle strength adequate with inflammation plantar aspect left heel with what appears to be a small bursal sac in the center portion the heel that's painful when pressed. Good range of motion was noted patient has good digital perfusion with mild depression of the arch     Assessment:     Inflammatory fasciitis with possibility for plantar bursal sac    Plan:     H&P x-rays reviewed and today I injected the plantar fascial left 3 mg Kenalog 5 mg Xylocaine and dispensed fascial brace with instructions on usage. Reappoint to reevaluate

## 2015-10-18 ENCOUNTER — Telehealth: Payer: Self-pay | Admitting: *Deleted

## 2015-10-18 MED ORDER — MELOXICAM 15 MG PO TABS: 15.0000 mg | ORAL_TABLET | Freq: Every day | ORAL | Status: AC

## 2015-10-18 NOTE — Telephone Encounter (Signed)
Pt states seen in office 12/09'/2016 and the Meloxicam has not been called to her pharmacy.  Dr. Charlsie Merlesegal states meloxicam 15mg  #30 1 tablet every day,+2refills. Orders to pt and [facility].

## 2015-11-03 ENCOUNTER — Ambulatory Visit: Payer: Medicare Other | Admitting: Podiatry

## 2015-11-14 ENCOUNTER — Ambulatory Visit: Payer: Medicare Other | Admitting: Podiatry

## 2016-02-01 ENCOUNTER — Ambulatory Visit (INDEPENDENT_AMBULATORY_CARE_PROVIDER_SITE_OTHER): Payer: Medicare Other | Admitting: Podiatry

## 2016-02-01 DIAGNOSIS — M722 Plantar fascial fibromatosis: Secondary | ICD-10-CM | POA: Diagnosis not present

## 2016-02-01 MED ORDER — TRIAMCINOLONE ACETONIDE 10 MG/ML IJ SUSP
10.0000 mg | Freq: Once | INTRAMUSCULAR | Status: AC
  Administered 2016-02-01: 10 mg

## 2016-02-01 NOTE — Progress Notes (Signed)
Subjective:     Patient ID: Brandy EnsignLinda B Lamb, female   DOB: 10/24/1941, 75 y.o.   MRN: [id number]  HPI patient states my heel has started to bother me quite a bit again and my husband's in the hospital saw him on my foot a lot   Review of Systems     Objective:   Physical Exam Neurovascular status intact muscle strength adequate with discomfort in the plantar heel left at the insertional point tendon into the calcaneus with fluid buildup noted    Assessment:     Continue plantar fasciitis left    Plan:     Reinjected the plantar fascia left 3 mg Kenalog 5 mill grams Xylocaine and discussed long-term orthotics which patient will have made and will be seen back as needed

## 2016-02-14 ENCOUNTER — Other Ambulatory Visit (HOSPITAL_COMMUNITY): Payer: Self-pay | Admitting: Internal Medicine

## 2016-02-14 DIAGNOSIS — Z780 Asymptomatic menopausal state: Secondary | ICD-10-CM

## 2016-02-15 ENCOUNTER — Ambulatory Visit (HOSPITAL_COMMUNITY)
Admission: RE | Admit: 2016-02-15 | Discharge: 2016-02-15 | Disposition: A | Discharge status: Home / Self Care | Payer: Medicare Other | Source: Ambulatory Visit | Attending: Internal Medicine | Admitting: Internal Medicine

## 2016-02-15 DIAGNOSIS — M85852 Other specified disorders of bone density and structure, left thigh: Secondary | ICD-10-CM | POA: Diagnosis not present

## 2016-02-15 DIAGNOSIS — M810 Age-related osteoporosis without current pathological fracture: Secondary | ICD-10-CM | POA: Diagnosis not present

## 2016-02-15 DIAGNOSIS — Z780 Asymptomatic menopausal state: Secondary | ICD-10-CM | POA: Diagnosis present

## 2016-05-25 ENCOUNTER — Telehealth: Payer: Self-pay | Admitting: *Deleted

## 2016-05-25 NOTE — Telephone Encounter (Signed)
Pt states her foot still hurts the shot doesn't help, she wants therapy. I told pt she had not had a continued series of treatment whether it was injections or not, and part of treatment was to be consistent and follow up with the doctor. Last appt 01/2016.  I told pt there were other treatments available, certainly PT could be discussed with Dr. Charlsie Merlesegal.  I transferred to schedulers.

## 2016-05-31 ENCOUNTER — Ambulatory Visit (INDEPENDENT_AMBULATORY_CARE_PROVIDER_SITE_OTHER): Payer: Medicare Other | Admitting: Podiatry

## 2016-05-31 ENCOUNTER — Ambulatory Visit (INDEPENDENT_AMBULATORY_CARE_PROVIDER_SITE_OTHER): Payer: Medicare Other

## 2016-05-31 ENCOUNTER — Encounter: Payer: Self-pay | Admitting: Podiatry

## 2016-05-31 VITALS — BP 121/58 | HR 58

## 2016-05-31 DIAGNOSIS — M722 Plantar fascial fibromatosis: Secondary | ICD-10-CM

## 2016-05-31 DIAGNOSIS — M79672 Pain in left foot: Secondary | ICD-10-CM

## 2016-05-31 NOTE — Progress Notes (Signed)
Subjective:     Patient ID: Brandy Lamb, female   DOB: [birth year], 75 y.o.   MRN: [id number]  HPI patient states the left heel is still really bothering her a lot and now she's getting pain on top of her foot from walking differently and this is been going on for close two-year   Review of Systems     Objective:   Physical Exam Neurovascular status intact muscle strength adequate and continued discomfort left plantar fashion medial band with exquisite discomfort when palpated and also noted to have moderate discomfort in the dorsum of the left foot localized in nature    Assessment:     Continued acute plantar fasciitis left with compensatory pain in the midfoot and top of the left foot    Plan:     Reviewed condition and recommended long-term shockwave explaining this to her. She wants to try physical therapy first which I think is reasonable and today I went ahead and I did place her in an air fracture walker to immobilize due to the new areas of discomfort and to help with the shockwave when performed

## 2016-05-31 NOTE — Patient Instructions (Signed)

## 2016-06-01 ENCOUNTER — Telehealth: Payer: Self-pay

## 2016-06-01 DIAGNOSIS — M722 Plantar fascial fibromatosis: Secondary | ICD-10-CM

## 2016-06-01 NOTE — Telephone Encounter (Signed)
Pt called to give information on the PT office near her home that she would prefer to go to  [facility]: [phone], fax [phone]

## 2016-06-07 NOTE — Telephone Encounter (Signed)
Pt states she has an appt at [facility] on 06/12/2016, needs orders. Dr. Charlsie Merles states PT to left heel for plantar fasciitis, focus on stretching and ROM.

## 2016-07-12 ENCOUNTER — Ambulatory Visit: Payer: Medicare Other | Admitting: Podiatry

## 2016-09-17 ENCOUNTER — Encounter: Payer: Self-pay | Admitting: Internal Medicine

## 2017-03-20 ENCOUNTER — Encounter (HOSPITAL_COMMUNITY): Payer: Medicare Other

## 2017-03-28 IMAGING — DX DG WRIST COMPLETE 3+V*R*
4 series · 4 of 4 positions shown · non-contrast
Comparison: None.

CLINICAL DATA: Pain after tripping and fall today

EXAM:
RIGHT WRIST - COMPLETE 3+ VIEW

[wrist pa]
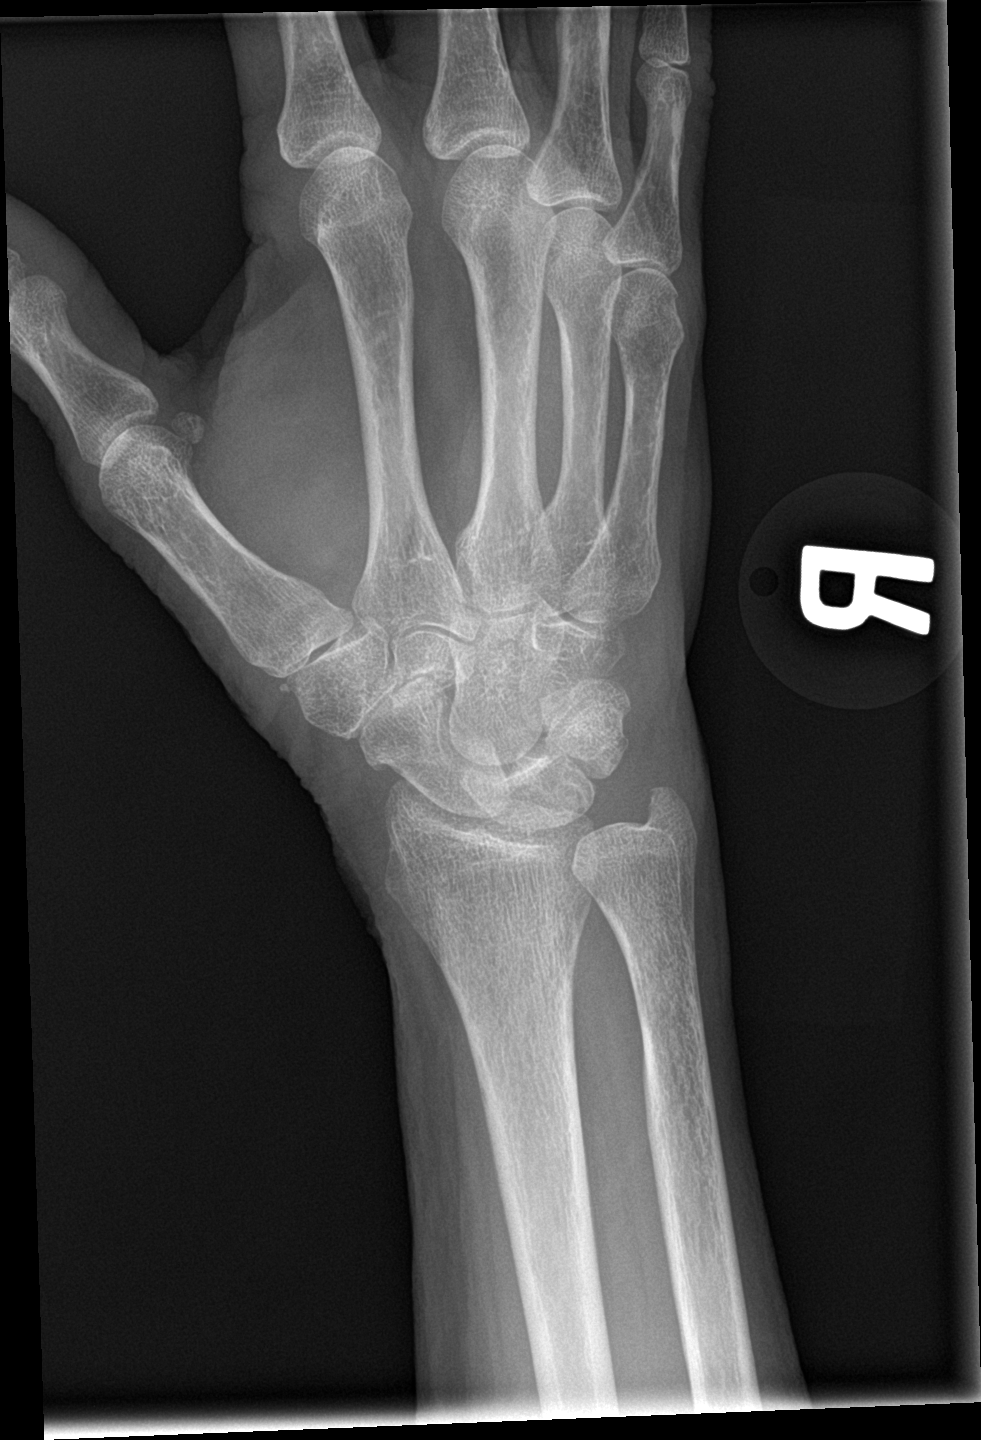

[wrist obl]
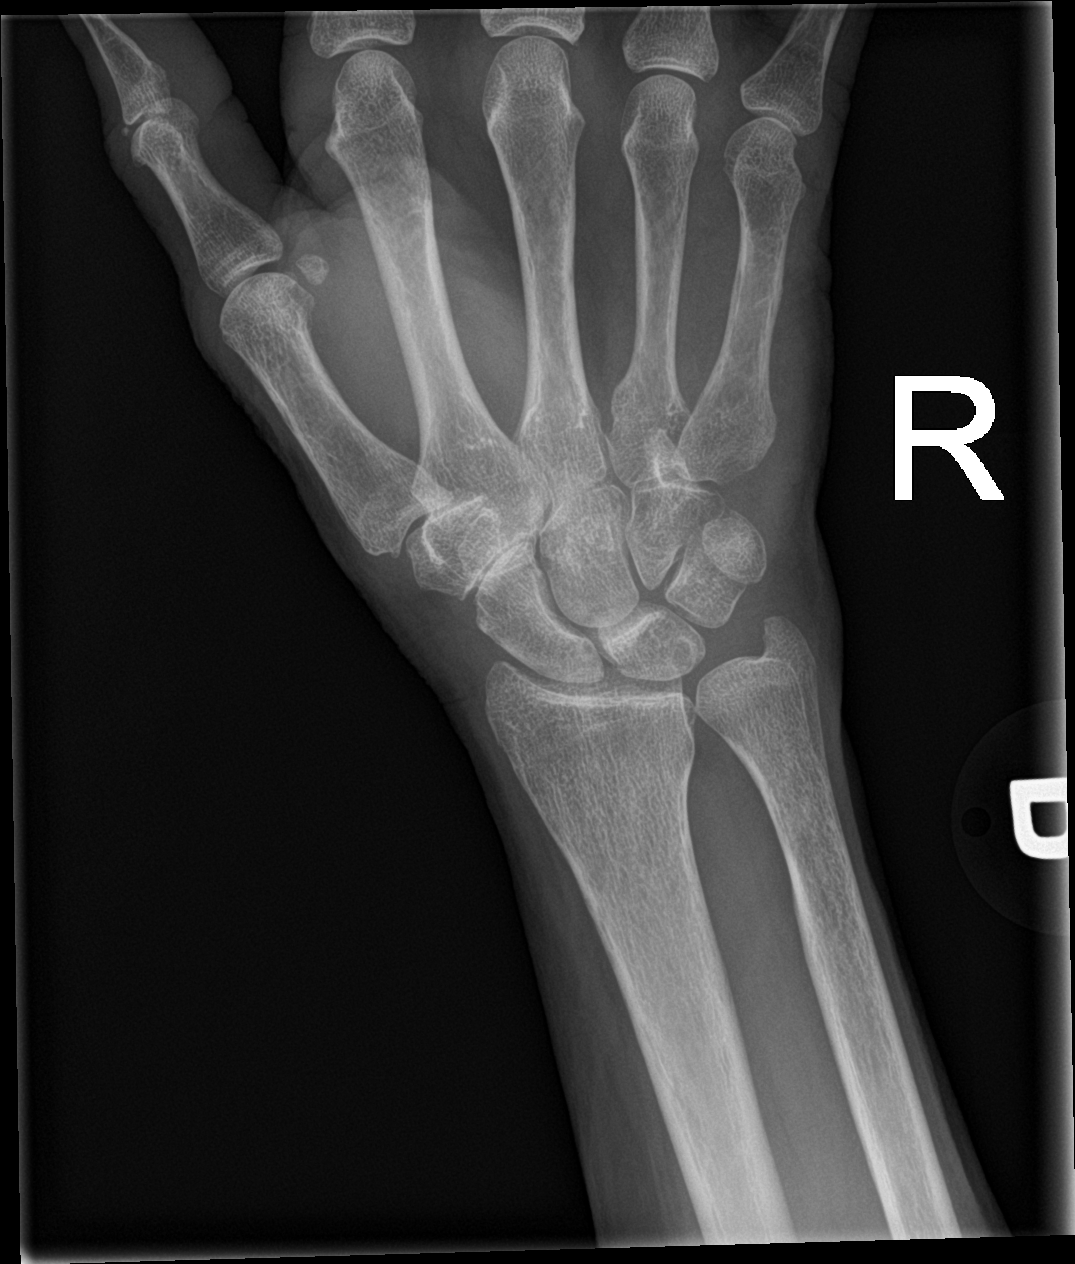

[wrist lat]
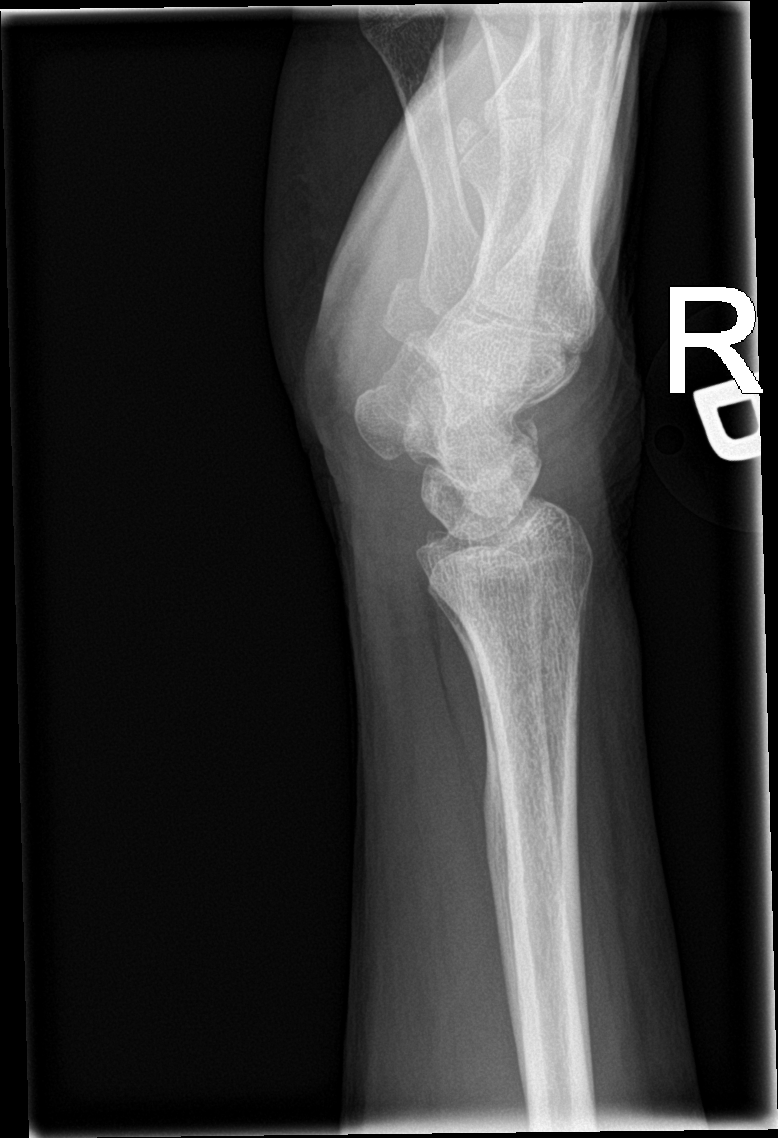

[wrist navicular]
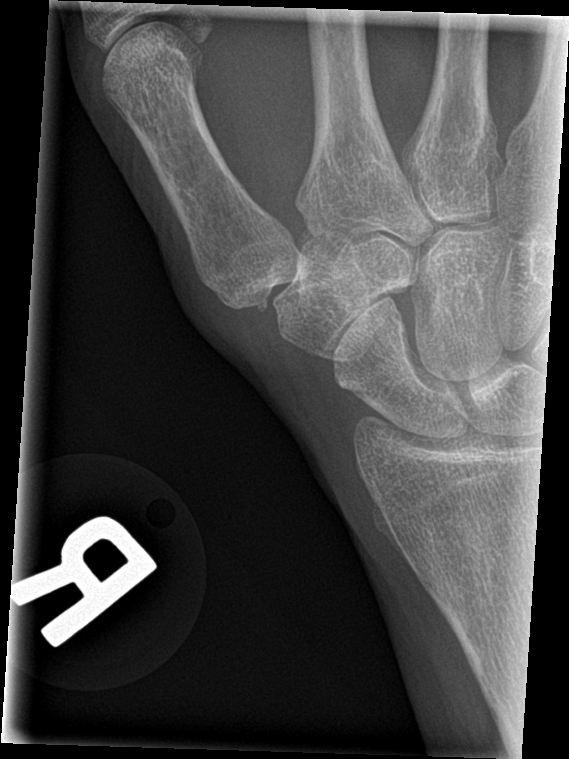

[4 of 4 positions shown; findings below may reference images not displayed]

FINDINGS: There is mild irregularity at the dorsal aspect of the triquetrum
which may represent a nondisplaced acute fracture. There are no
other areas of suspicion for acute fracture. There is no
dislocation. There is mild arthritis at the radial aspect of the
wrist. There is no radiopaque foreign body.
IMPRESSION: Probable nondisplaced fracture at the dorsal aspect of the
triquetrum.

## 2017-03-28 IMAGING — DX DG ANKLE COMPLETE 3+V*L*
3 series · 3 of 3 positions shown · non-contrast
Comparison: None.

CLINICAL DATA: Lateral ankle pain after tripping over a bag strap
today

EXAM:
LEFT ANKLE COMPLETE - 3+ VIEW

[ankle ap]
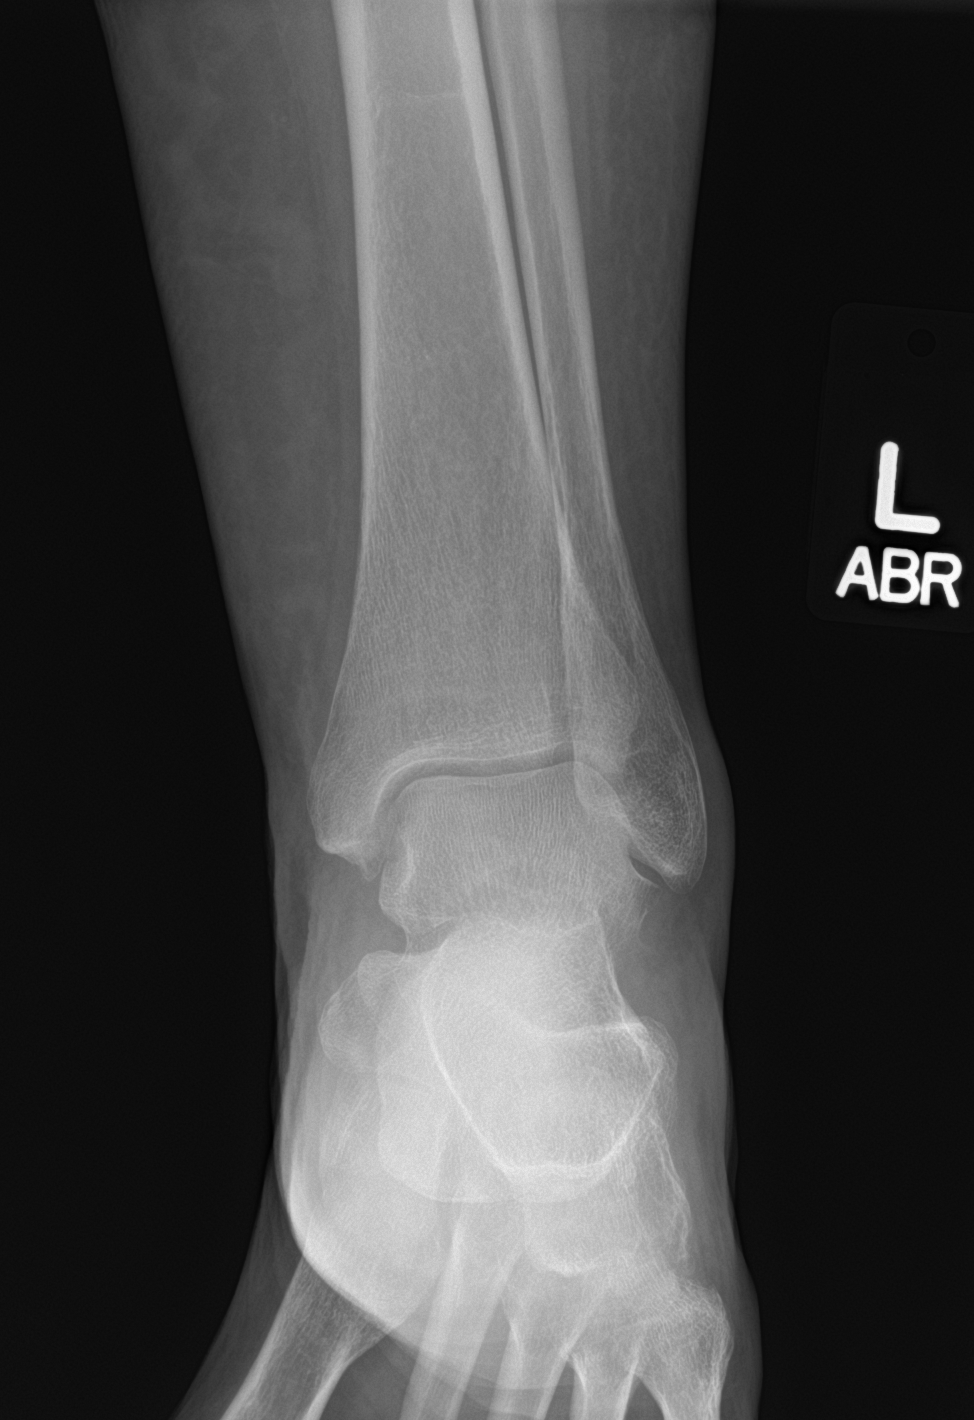

[ankle obl]
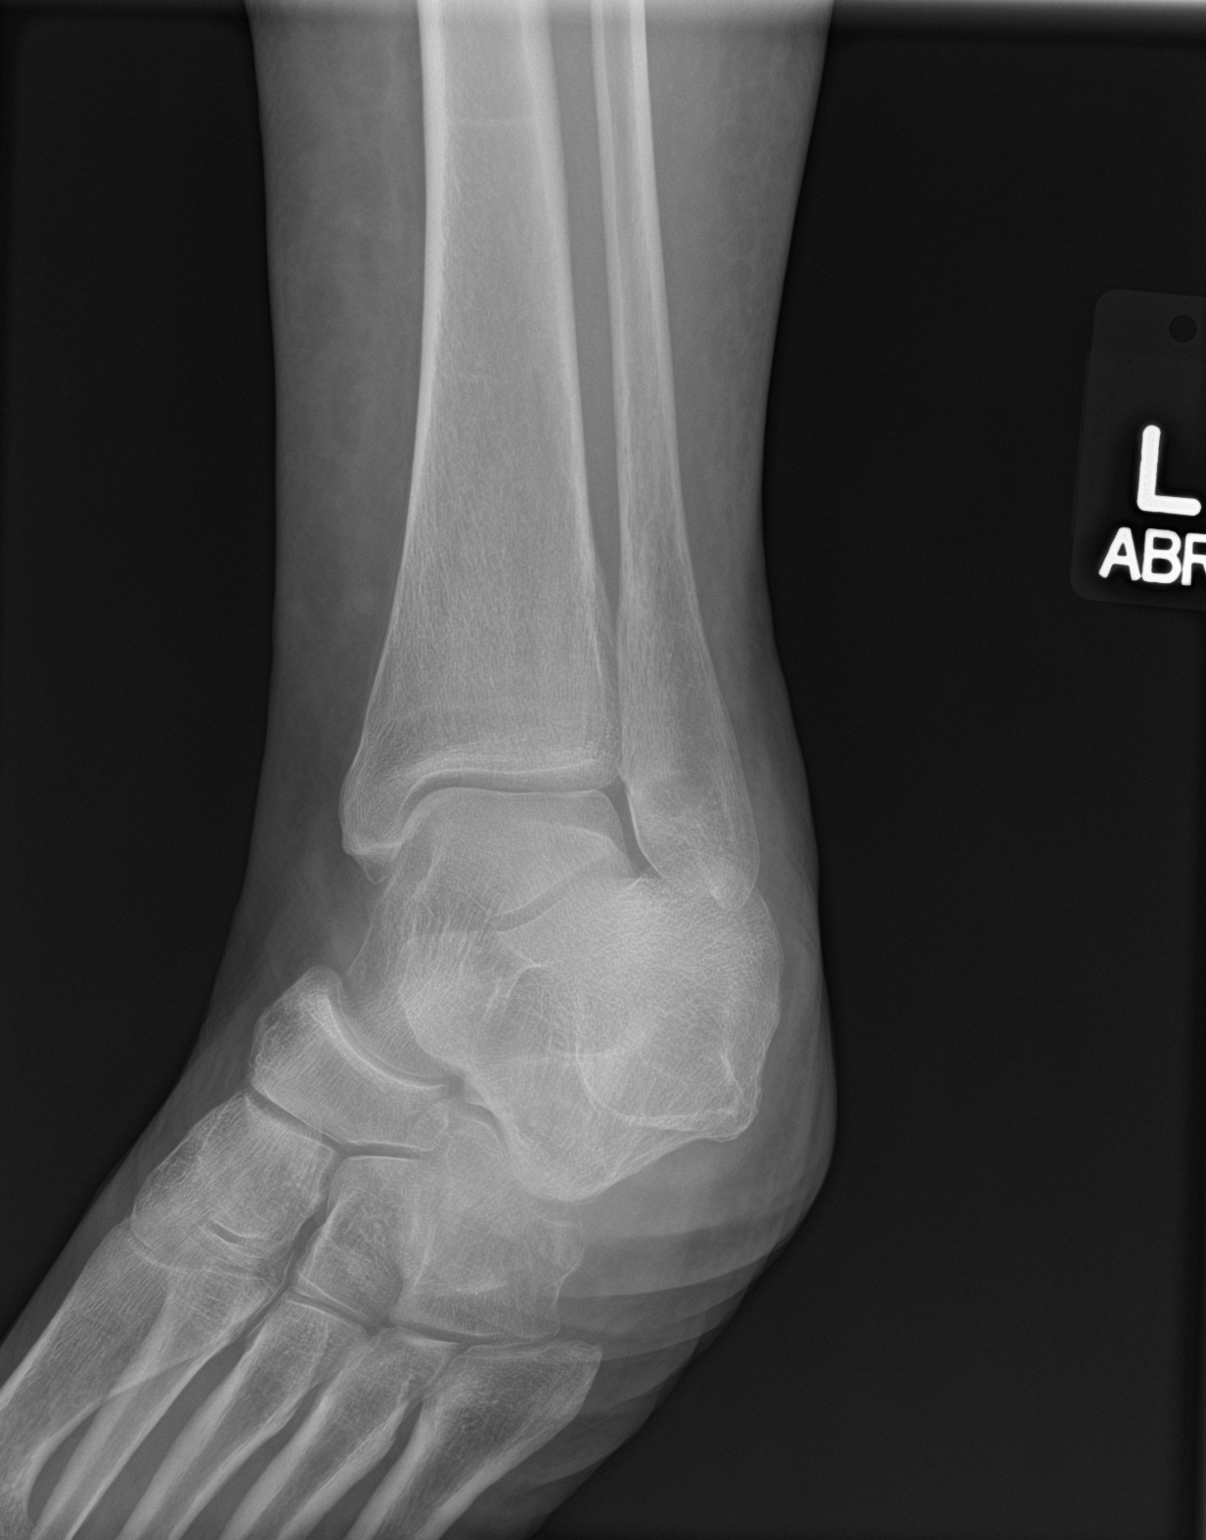

[ankle lat]
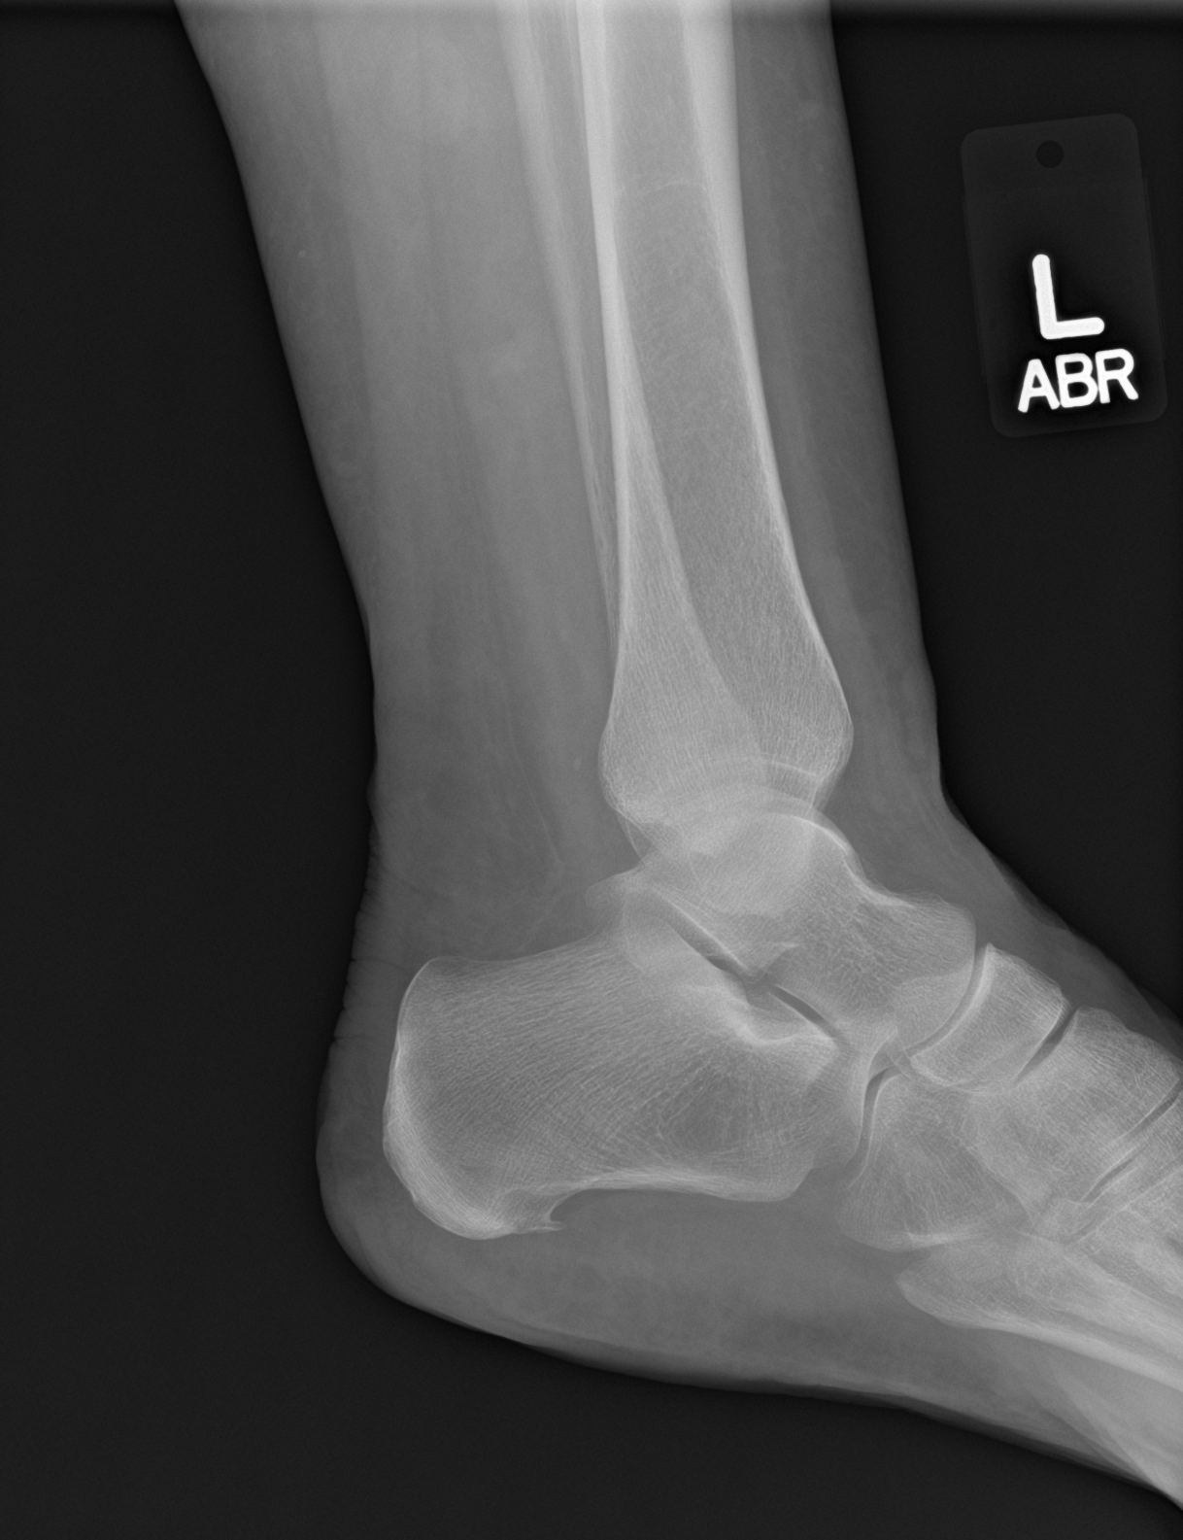

[3 of 3 positions shown; findings below may reference images not displayed]

FINDINGS: There is no evidence of fracture, dislocation, or joint effusion.
There is no evidence of arthropathy or other focal bone abnormality.
Soft tissues are unremarkable.
IMPRESSION: Negative.

## 2017-08-14 ENCOUNTER — Ambulatory Visit: Payer: Medicare Other | Admitting: Internal Medicine

## 2018-12-25 DIAGNOSIS — H2513 Age-related nuclear cataract, bilateral: Secondary | ICD-10-CM | POA: Diagnosis not present

## 2019-03-02 DIAGNOSIS — M549 Dorsalgia, unspecified: Secondary | ICD-10-CM | POA: Diagnosis not present

## 2019-03-05 DIAGNOSIS — M546 Pain in thoracic spine: Secondary | ICD-10-CM | POA: Diagnosis not present

## 2019-03-05 DIAGNOSIS — M25512 Pain in left shoulder: Secondary | ICD-10-CM | POA: Diagnosis not present

## 2019-03-10 DIAGNOSIS — M25512 Pain in left shoulder: Secondary | ICD-10-CM | POA: Diagnosis not present

## 2019-03-10 DIAGNOSIS — M546 Pain in thoracic spine: Secondary | ICD-10-CM | POA: Diagnosis not present

## 2019-03-12 DIAGNOSIS — M546 Pain in thoracic spine: Secondary | ICD-10-CM | POA: Diagnosis not present

## 2019-03-12 DIAGNOSIS — M25512 Pain in left shoulder: Secondary | ICD-10-CM | POA: Diagnosis not present

## 2019-03-17 DIAGNOSIS — M546 Pain in thoracic spine: Secondary | ICD-10-CM | POA: Diagnosis not present

## 2019-03-17 DIAGNOSIS — M25512 Pain in left shoulder: Secondary | ICD-10-CM | POA: Diagnosis not present

## 2019-03-19 DIAGNOSIS — M546 Pain in thoracic spine: Secondary | ICD-10-CM | POA: Diagnosis not present

## 2019-03-19 DIAGNOSIS — M25512 Pain in left shoulder: Secondary | ICD-10-CM | POA: Diagnosis not present

## 2019-03-24 DIAGNOSIS — M25512 Pain in left shoulder: Secondary | ICD-10-CM | POA: Diagnosis not present

## 2019-03-24 DIAGNOSIS — M546 Pain in thoracic spine: Secondary | ICD-10-CM | POA: Diagnosis not present

## 2019-03-26 DIAGNOSIS — M546 Pain in thoracic spine: Secondary | ICD-10-CM | POA: Diagnosis not present

## 2019-03-26 DIAGNOSIS — M25512 Pain in left shoulder: Secondary | ICD-10-CM | POA: Diagnosis not present

## 2019-03-31 DIAGNOSIS — M25512 Pain in left shoulder: Secondary | ICD-10-CM | POA: Diagnosis not present

## 2019-03-31 DIAGNOSIS — M546 Pain in thoracic spine: Secondary | ICD-10-CM | POA: Diagnosis not present

## 2019-04-06 DIAGNOSIS — M810 Age-related osteoporosis without current pathological fracture: Secondary | ICD-10-CM | POA: Diagnosis not present

## 2019-04-06 DIAGNOSIS — I700 Atherosclerosis of aorta: Secondary | ICD-10-CM | POA: Diagnosis not present

## 2019-04-06 DIAGNOSIS — E785 Hyperlipidemia, unspecified: Secondary | ICD-10-CM | POA: Diagnosis not present

## 2019-04-06 DIAGNOSIS — Z79899 Other long term (current) drug therapy: Secondary | ICD-10-CM | POA: Diagnosis not present

## 2019-04-06 DIAGNOSIS — K219 Gastro-esophageal reflux disease without esophagitis: Secondary | ICD-10-CM | POA: Diagnosis not present

## 2019-04-07 DIAGNOSIS — M25512 Pain in left shoulder: Secondary | ICD-10-CM | POA: Diagnosis not present

## 2019-04-07 DIAGNOSIS — M546 Pain in thoracic spine: Secondary | ICD-10-CM | POA: Diagnosis not present

## 2019-04-13 DIAGNOSIS — E785 Hyperlipidemia, unspecified: Secondary | ICD-10-CM | POA: Diagnosis not present

## 2019-04-13 DIAGNOSIS — I700 Atherosclerosis of aorta: Secondary | ICD-10-CM | POA: Diagnosis not present

## 2019-04-13 DIAGNOSIS — M810 Age-related osteoporosis without current pathological fracture: Secondary | ICD-10-CM | POA: Diagnosis not present

## 2019-04-21 DIAGNOSIS — M25512 Pain in left shoulder: Secondary | ICD-10-CM | POA: Diagnosis not present

## 2019-04-21 DIAGNOSIS — M546 Pain in thoracic spine: Secondary | ICD-10-CM | POA: Diagnosis not present

## 2019-07-12 DIAGNOSIS — H6120 Impacted cerumen, unspecified ear: Secondary | ICD-10-CM | POA: Diagnosis not present

## 2019-10-05 DIAGNOSIS — R6889 Other general symptoms and signs: Secondary | ICD-10-CM | POA: Diagnosis not present

## 2019-10-06 DIAGNOSIS — Z1231 Encounter for screening mammogram for malignant neoplasm of breast: Secondary | ICD-10-CM | POA: Diagnosis not present

## 2019-11-30 DIAGNOSIS — R300 Dysuria: Secondary | ICD-10-CM | POA: Diagnosis not present

## 2019-12-30 DIAGNOSIS — R450 Nervousness: Secondary | ICD-10-CM | POA: Diagnosis not present

## 2019-12-31 DIAGNOSIS — R42 Dizziness and giddiness: Secondary | ICD-10-CM | POA: Diagnosis not present

## 2019-12-31 DIAGNOSIS — R531 Weakness: Secondary | ICD-10-CM | POA: Diagnosis not present

## 2020-01-01 DIAGNOSIS — R531 Weakness: Secondary | ICD-10-CM | POA: Diagnosis not present

## 2020-01-05 DIAGNOSIS — I491 Atrial premature depolarization: Secondary | ICD-10-CM | POA: Diagnosis not present

## 2020-01-05 DIAGNOSIS — R42 Dizziness and giddiness: Secondary | ICD-10-CM | POA: Diagnosis not present

## 2020-01-14 DIAGNOSIS — H16213 Exposure keratoconjunctivitis, bilateral: Secondary | ICD-10-CM | POA: Diagnosis not present

## 2020-01-26 DIAGNOSIS — R42 Dizziness and giddiness: Secondary | ICD-10-CM | POA: Diagnosis not present

## 2020-01-29 ENCOUNTER — Other Ambulatory Visit: Payer: Self-pay

## 2020-01-29 ENCOUNTER — Emergency Department
Admission: EM | Admit: 2020-01-29 | Discharge: 2020-01-29 | Disposition: A | Discharge status: Home / Self Care | Payer: Medicare Other | Attending: Emergency Medicine | Admitting: Emergency Medicine

## 2020-01-29 ENCOUNTER — Emergency Department: Payer: Medicare Other

## 2020-01-29 DIAGNOSIS — Z79899 Other long term (current) drug therapy: Secondary | ICD-10-CM | POA: Diagnosis not present

## 2020-01-29 DIAGNOSIS — R42 Dizziness and giddiness: Secondary | ICD-10-CM | POA: Diagnosis not present

## 2020-01-29 DIAGNOSIS — H532 Diplopia: Secondary | ICD-10-CM | POA: Diagnosis not present

## 2020-01-29 DIAGNOSIS — J0101 Acute recurrent maxillary sinusitis: Secondary | ICD-10-CM

## 2020-01-29 DIAGNOSIS — Z87891 Personal history of nicotine dependence: Secondary | ICD-10-CM | POA: Insufficient documentation

## 2020-01-29 DIAGNOSIS — J0100 Acute maxillary sinusitis, unspecified: Secondary | ICD-10-CM | POA: Diagnosis not present

## 2020-01-29 LAB — CBC
HCT: 42.8 % (ref 36.0–46.0)
Hemoglobin: 14.6 g/dL (ref 12.0–15.0)
MCH: 33.5 pg (ref 26.0–34.0)
MCHC: 34.1 g/dL (ref 30.0–36.0)
MCV: 98.2 fL (ref 80.0–100.0)
Platelets: 286 10*3/uL (ref 150–400)
RBC: 4.36 MIL/uL (ref 3.87–5.11)
RDW: 12 % (ref 11.5–15.5)
WBC: 8.8 10*3/uL (ref 4.0–10.5)
nRBC: 0 % (ref 0.0–0.2)

## 2020-01-29 LAB — URINALYSIS, COMPLETE (UACMP) WITH MICROSCOPIC
Bacteria, UA: NONE SEEN
Bilirubin Urine: NEGATIVE
Glucose, UA: NEGATIVE mg/dL
Hgb urine dipstick: NEGATIVE
Ketones, ur: NEGATIVE mg/dL
Nitrite: NEGATIVE
Protein, ur: NEGATIVE mg/dL
Specific Gravity, Urine: 1.008 (ref 1.005–1.030)
pH: 7 (ref 5.0–8.0)

## 2020-01-29 LAB — BASIC METABOLIC PANEL
Anion gap: 8 (ref 5–15)
BUN: 12 mg/dL (ref 8–23)
CO2: 27 mmol/L (ref 22–32)
Calcium: 9.7 mg/dL (ref 8.9–10.3)
Chloride: 107 mmol/L (ref 98–111)
Creatinine, Ser: 0.85 mg/dL (ref 0.44–1.00)
GFR calc Af Amer: >60 mL/min (ref >60)
GFR calc non Af Amer: >60 mL/min (ref >60)
Glucose, Bld: 101 mg/dL — ABNORMAL HIGH (ref 70–99)
Potassium: 4.7 mmol/L (ref 3.5–5.1)
Sodium: 142 mmol/L (ref 135–145)

## 2020-01-29 LAB — TROPONIN I (HIGH SENSITIVITY): Troponin I (High Sensitivity): <2 ng/L (ref <18)

## 2020-01-29 MED ORDER — FLUTICASONE PROPIONATE 50 MCG/ACT NA SUSP: 2.0000 | Freq: Every day | NASAL | 0 refills | Status: AC

## 2020-01-29 MED ORDER — AMOXICILLIN-POT CLAVULANATE 875-125 MG PO TABS: 1.0000 | ORAL_TABLET | Freq: Two times a day (BID) | ORAL | 0 refills | Status: AC

## 2020-01-29 NOTE — ED Notes (Signed)
Pt returned from MRI °

## 2020-01-29 NOTE — ED Triage Notes (Signed)
Pt states that she has been having dizziness for the past week, pt states that it doesn't matter what she does or position she is in she has the dizziness, pt states that her dr recommended a mri, but pt said she really didn't want to from a bad past experience, but states that she will do whatever needs to be done to determine the reason for the persistent dizziness, denies weakness to either side of body, states that her legs feel weak bilat due to the way she is having to compensate for walking with the dizziness

## 2020-01-29 NOTE — ED Notes (Signed)
Pt transported to MRI 

## 2020-01-29 NOTE — ED Provider Notes (Signed)
[facility] Emergency Department Provider Note  ____________________________________________   First MD Initiated Contact with Patient 01/29/20 1230     (approximate)  I have reviewed the triage vital signs and the nursing notes.   HISTORY  Chief Complaint Dizziness    HPI Brandy Lamb is a 79 y.o. female  Here with dizziness. Pt reports that for the past week, she's had intermittent dizziness which she describes as a blurry sensation ni her eyes along with frontal maxillary and frontal sinus tenderness. She's had mild nasal congestion. She has seen an eye doctor for this with unremarkable exam. She's felt mildly weak and like she didn't want to get around due to this dizziness, so she hasn't been moving around as much. Reports she has a h/o recurrent sinus infections, sometimes w/ dizziness though not this severe. Ultimately, sx got severe enough for her to cal her PCP who told her to come for imaging. No slurred speech. No confusion. No focal numbness or weakness.        Past Medical History:  Diagnosis Date  . Colon polyps   . Diverticulosis   . GERD (gastroesophageal reflux disease)   . Heart murmur   . Hypercholesteremia   . Hyperlipidemia     Patient Active Problem List   Diagnosis Date Noted  . Personal history of colonic polyps 08/22/2011  . GERD (gastroesophageal reflux disease) 08/22/2011  . URI (upper respiratory infection) 08/22/2011  . Abdominal pain, generalized 08/22/2011  . Abdominal bloating 08/22/2011  . FH: colon cancer 08/22/2011    Class: Question of    Past Surgical History:  Procedure Laterality Date  . ABDOMINAL HYSTERECTOMY    . CHOLECYSTECTOMY    . COLONOSCOPY     polyps, Dr. Aleene Davidson. ?5-6 years ago.  Marland Kitchen COLONOSCOPY  09/19/2011   Procedure: COLONOSCOPY;  Surgeon: Corbin Ade, MD;  Location: [facility]ITE;  Service: Endoscopy;  Laterality: N/A;  7:30  . KNEE SURGERY     left  . PARTIAL HYSTERECTOMY    .  TOE SURGERY     2nd toe right foot    Prior to Admission medications   Medication Sig Start Date End Date Taking? Authorizing Provider  amoxicillin-clavulanate (AUGMENTIN) 875-125 MG tablet Take 1 tablet by mouth 2 (two) times daily for 7 days. 01/29/20 02/05/20  Shaune Pollack, MD  Calcium Carbonate-Vit D-Min (CALCIUM 1200 PO) Take 1 tablet by mouth daily.      [provider]  cholecalciferol (VITAMIN D) 1000 UNITS tablet Take 1,000 Units by mouth daily.      [provider]  Coenzyme Q10 (CO Q-10) 100 MG CAPS Take 1 capsule by mouth daily.      [provider]  fish oil-omega-3 fatty acids 1000 MG capsule Take 1 g by mouth daily.     [provider]  fluticasone (FLONASE) 50 MCG/ACT nasal spray Place 2 sprays into both nostrils daily for 14 days. 01/29/20 02/12/20  Shaune Pollack, MD  Garlic 1000 MG CAPS Take 1 capsule by mouth daily.      [provider]  lansoprazole (PREVACID) 15 MG capsule Take 15 mg by mouth daily.      [provider]  meloxicam (MOBIC) 15 MG tablet Take 1 tablet (15 mg total) by mouth daily. 10/18/15   Lenn Sink, DPM  Multiple Vitamin (ANTIOXIDANT A/C/E PO) Take 1 tablet by mouth daily.      [provider]  rosuvastatin (CRESTOR) 5 MG tablet Take  5 mg by mouth every other day.    [provider]  vitamin B-12 (CYANOCOBALAMIN) 1000 MCG tablet Take 1,000 mcg by mouth daily.      [provider]    Allergies Levaquin [levofloxacin in d5w]  Family History  Problem Relation Age of Onset  . GI problems Father        diverticulitis surgery, not sure but she thinks he had colon cancer  . Heart disease Mother 90       deceased  . Liver disease Neg Hx   . Anesthesia problems Neg Hx   . Hypotension Neg Hx   . Malignant hyperthermia Neg Hx   . Pseudochol deficiency Neg Hx     Social History Social History   Tobacco Use  . Smoking status: Former Smoker    Packs/day: 1.00     Years: 10.00    Pack years: 10.00    Types: Cigarettes    Quit date: 09/18/2005    Years since quitting: 14.3  . Tobacco comment: quit about 20+ yrs  Substance Use Topics  . Alcohol use: Yes    Comment: once in a blue moon  . Drug use: No    Review of Systems  Review of Systems  Constitutional: Positive for fatigue. Negative for fever.  HENT: Positive for sinus pressure and sinus pain. Negative for congestion and sore throat.   Eyes: Negative for visual disturbance.  Respiratory: Negative for cough and shortness of breath.   Cardiovascular: Negative for chest pain.  Gastrointestinal: Negative for abdominal pain, diarrhea, nausea and vomiting.  Genitourinary: Negative for flank pain.  Musculoskeletal: Negative for back pain and neck pain.  Skin: Negative for rash and wound.  Neurological: Positive for dizziness and weakness.  All other systems reviewed and are negative.    ____________________________________________  PHYSICAL EXAM:      VITAL SIGNS: ED Triage Vitals  Enc Vitals Group     BP 01/29/20 1039 (!) 145/57     Pulse Rate 01/29/20 1039 60     Resp 01/29/20 1039 16     Temp 01/29/20 1039 98.4 F (36.9 C)     Temp Source 01/29/20 1039 Oral     SpO2 01/29/20 1039 95 %     Weight 01/29/20 1040 150 lb (68 kg)     Height 01/29/20 1040 5\' 1"  (1.549 m)     Head Circumference --      Peak Flow --      Pain Score 01/29/20 1039 0     Pain Loc --      Pain Edu? --      Excl. in [place]? --      Physical Exam Vitals and nursing note reviewed.  Constitutional:      General: She is not in acute distress.    Appearance: She is well-developed.  HENT:     Head: Normocephalic and atraumatic.     Comments: Moderate nasal mucosal edema b/l with maxillary sinus fullness and TTP. No facial erythema or swelling. Serous effusions noted in TMs bilaterally. No mastoid tenderness, swelling, or erythema. Eyes:     Conjunctiva/sclera: Conjunctivae normal.  Cardiovascular:     Rate  and Rhythm: Normal rate and regular rhythm.     Heart sounds: Normal heart sounds. No murmur. No friction rub.  Pulmonary:     Effort: Pulmonary effort is normal. No respiratory distress.     Breath sounds: Normal breath sounds. No wheezing or rales.  Abdominal:  General: There is no distension.     Palpations: Abdomen is soft.     Tenderness: There is no abdominal tenderness.  Musculoskeletal:     Cervical back: Neck supple.  Skin:    General: Skin is warm.     Capillary Refill: Capillary refill takes less than 2 seconds.  Neurological:     Mental Status: She is alert and oriented to person, place, and time.     Motor: No abnormal muscle tone.     Comments: Neurological Exam:  Mental Status: Alert and oriented to person, place, and time. Attention and concentration normal. Speech clear. Recent memory is intact. Cranial Nerves: Visual fields grossly intact. EOMI and PERRLA. No nystagmus noted. Facial sensation intact at forehead, maxillary cheek, and chin/mandible bilaterally. No facial asymmetry or weakness. Hearing grossly normal. Uvula is midline, and palate elevates symmetrically. Normal SCM and trapezius strength. Tongue midline without fasciculations. Motor: Muscle strength 5/5 in proximal and distal UE and LE bilaterally. No pronator drift. Muscle tone normal.  Sensation: Intact to light touch in upper and lower extremities distally bilaterally.  Gait: Normal without ataxia. Coordination: Normal FTN bilaterally.          ____________________________________________   LABS (all labs ordered are listed, but only abnormal results are displayed)  Labs Reviewed  BASIC METABOLIC PANEL - Abnormal; Notable for the following components:      Result Value   Glucose, Bld 101 (*)    All other components within normal limits  URINALYSIS, COMPLETE (UACMP) WITH MICROSCOPIC - Abnormal; Notable for the following components:   Color, Urine YELLOW (*)    APPearance CLEAR (*)     Leukocytes,Ua TRACE (*)    All other components within normal limits  CBC  CBG MONITORING, ED  TROPONIN I (HIGH SENSITIVITY)  TROPONIN I (HIGH SENSITIVITY)    ____________________________________________  EKG: Sinus bradycardia, VR 59. PR 166, QRS 66, QTc 384. No acute ST elevations or depressions. ________________________________________  RADIOLOGY All imaging, including plain films, CT scans, and ultrasounds, independently reviewed by me, and interpretations confirmed via formal radiology reads.  ED MD interpretation:   MR Brain WO: Normal MRI  Official radiology report(s): MR BRAIN WO CONTRAST  Result Date: 01/29/2020 CLINICAL DATA:  Diplopia, dizziness. EXAM: MRI HEAD WITHOUT CONTRAST TECHNIQUE: Multiplanar, multiecho pulse sequences of the brain and surrounding structures were obtained without intravenous contrast. COMPARISON:  Head CT April 21, 2010 FINDINGS: Brain: No acute infarction, hemorrhage, hydrocephalus, extra-axial collection or mass lesion. Few scattered foci of T2 hyperintensity are seen within the white matter of the cerebral hemispheres, nonspecific, most likely related to chronic small vessel ischemia. Prominence of the supratentorial ventricles and mild prominence of the cerebral sulci reflecting parenchymal volume loss. Vascular: Normal flow voids. Skull and upper cervical spine: Normal marrow signal. Degenerative changes of the cervical spine noted with small anterolisthesis of C3 over C4. Sinuses/Orbits: Negative. IMPRESSION: 1. No acute intracranial abnormality. 2. Mild parenchymal volume loss and chronic small vessel ischemia. Electronically Signed   By: Baldemar Lenis M.D.   On: 01/29/2020 14:05    ____________________________________________  PROCEDURES   Procedure(s) performed (including Critical Care):  Procedures  ____________________________________________  INITIAL IMPRESSION / MDM / ASSESSMENT AND PLAN / ED COURSE  As part of my  medical decision making, I reviewed the following data within the electronic MEDICAL RECORD NUMBER Nursing notes reviewed and incorporated, Old chart reviewed, Notes from prior ED visits, and [place] Controlled Substance Database       *  TUWANNA KRAUSZ was evaluated in Emergency Department on 01/29/2020 for the symptoms described in the history of present illness. She was evaluated in the context of the global COVID-19 pandemic, which necessitated consideration that the patient might be at risk for infection with the SARS-CoV-2 virus that causes COVID-19. Institutional protocols and algorithms that pertain to the evaluation of patients at risk for COVID-19 are in a state of rapid change based on information released by regulatory bodies including the CDC and federal and state organizations. These policies and algorithms were followed during the patient's care in the ED.  Some ED evaluations and interventions may be delayed as a result of limited staffing during the pandemic.*     Medical Decision Making:  79 yo F here with intermittent dizziness/blurred vision. Eye exam shows EOMI, no nystagmus, no disconjugate gaze or skew and she's had normal ophtho exam since sx onset - doubt intra-ocular abnormality. She does have evidence of mild sinusitis on exam with h/o similar sx, so this could be contributing to mild inner/middle ear dysfunction. Will tx with Augmentin/Flonase. Otherwise, given her age MRI obtained which shows no mass, lesion, or stroke. She feels normal in ED and is ambulatory w/o difficulty. Labs reassuring. Will tx for possible sinusitis with peripheral vertigo type sx, refer for outpt follow-up.  ____________________________________________  FINAL CLINICAL IMPRESSION(S) / ED DIAGNOSES  Final diagnoses:  Dizziness  Acute recurrent maxillary sinusitis     MEDICATIONS GIVEN DURING THIS VISIT:  Medications - No data to display   ED Discharge Orders         Ordered     amoxicillin-clavulanate (AUGMENTIN) 875-125 MG tablet  2 times daily     01/29/20 1439    fluticasone (FLONASE) 50 MCG/ACT nasal spray  Daily     01/29/20 1439           Note:  This document was prepared using Dragon voice recognition software and may include unintentional dictation errors.   Shaune Pollack, MD 01/29/20 1446

## 2020-01-29 NOTE — ED Notes (Signed)
Pt states that she has been having episodes of lightheadedness. Pt reports that her vision is "fuzzy" when having dizziness. Denies numbness or weakness on one side of the body but does states that she feels like bilateral legs are weaker than normal. Pt has equal sensation on both sides. Pt is in NAD.

## 2020-01-29 NOTE — Discharge Instructions (Addendum)
Take the antibiotic and nasal spray as prescribed  Drink plenty of fluids  It was a pleasure taking care of you!

## 2020-02-10 DIAGNOSIS — Z1152 Encounter for screening for COVID-19: Secondary | ICD-10-CM | POA: Diagnosis not present

## 2020-02-10 DIAGNOSIS — Z03818 Encounter for observation for suspected exposure to other biological agents ruled out: Secondary | ICD-10-CM | POA: Diagnosis not present

## 2020-04-26 DIAGNOSIS — J0140 Acute pansinusitis, unspecified: Secondary | ICD-10-CM | POA: Diagnosis not present

## 2020-08-24 DIAGNOSIS — R5383 Other fatigue: Secondary | ICD-10-CM | POA: Diagnosis not present

## 2020-12-01 DIAGNOSIS — J069 Acute upper respiratory infection, unspecified: Secondary | ICD-10-CM | POA: Diagnosis not present

## 2020-12-05 ENCOUNTER — Other Ambulatory Visit (HOSPITAL_COMMUNITY): Payer: Self-pay | Admitting: Internal Medicine

## 2020-12-05 DIAGNOSIS — Z1231 Encounter for screening mammogram for malignant neoplasm of breast: Secondary | ICD-10-CM

## 2020-12-14 ENCOUNTER — Other Ambulatory Visit: Payer: Self-pay

## 2020-12-14 ENCOUNTER — Emergency Department (HOSPITAL_COMMUNITY): Payer: Medicare Other

## 2020-12-14 ENCOUNTER — Emergency Department (HOSPITAL_COMMUNITY)
Admission: EM | Admit: 2020-12-14 | Discharge: 2020-12-14 | Disposition: A | Discharge status: Home / Self Care | Payer: Medicare Other | Attending: Emergency Medicine | Admitting: Emergency Medicine

## 2020-12-14 ENCOUNTER — Encounter (HOSPITAL_COMMUNITY): Payer: Self-pay | Admitting: Emergency Medicine

## 2020-12-14 ENCOUNTER — Ambulatory Visit (HOSPITAL_COMMUNITY): Payer: Medicare Other

## 2020-12-14 DIAGNOSIS — I6782 Cerebral ischemia: Secondary | ICD-10-CM | POA: Diagnosis not present

## 2020-12-14 DIAGNOSIS — R531 Weakness: Secondary | ICD-10-CM | POA: Diagnosis not present

## 2020-12-14 DIAGNOSIS — Z87891 Personal history of nicotine dependence: Secondary | ICD-10-CM | POA: Diagnosis not present

## 2020-12-14 DIAGNOSIS — R9431 Abnormal electrocardiogram [ECG] [EKG]: Secondary | ICD-10-CM | POA: Diagnosis not present

## 2020-12-14 DIAGNOSIS — G319 Degenerative disease of nervous system, unspecified: Secondary | ICD-10-CM | POA: Diagnosis not present

## 2020-12-14 DIAGNOSIS — M6281 Muscle weakness (generalized): Secondary | ICD-10-CM | POA: Insufficient documentation

## 2020-12-14 DIAGNOSIS — Z20822 Contact with and (suspected) exposure to covid-19: Secondary | ICD-10-CM | POA: Diagnosis not present

## 2020-12-14 DIAGNOSIS — R42 Dizziness and giddiness: Secondary | ICD-10-CM | POA: Insufficient documentation

## 2020-12-14 LAB — BASIC METABOLIC PANEL
Anion gap: 6 (ref 5–15)
BUN: 13 mg/dL (ref 8–23)
CO2: 26 mmol/L (ref 22–32)
Calcium: 9.6 mg/dL (ref 8.9–10.3)
Chloride: 105 mmol/L (ref 98–111)
Creatinine, Ser: 0.93 mg/dL (ref 0.44–1.00)
GFR, Estimated: >60 mL/min (ref >60)
Glucose, Bld: 91 mg/dL (ref 70–99)
Potassium: 4.8 mmol/L (ref 3.5–5.1)
Sodium: 137 mmol/L (ref 135–145)

## 2020-12-14 LAB — CBC
HCT: 43.3 % (ref 36.0–46.0)
Hemoglobin: 14.2 g/dL (ref 12.0–15.0)
MCH: 32.9 pg (ref 26.0–34.0)
MCHC: 32.8 g/dL (ref 30.0–36.0)
MCV: 100.2 fL — ABNORMAL HIGH (ref 80.0–100.0)
Platelets: 297 10*3/uL (ref 150–400)
RBC: 4.32 MIL/uL (ref 3.87–5.11)
RDW: 12.4 % (ref 11.5–15.5)
WBC: 9.6 10*3/uL (ref 4.0–10.5)
nRBC: 0 % (ref 0.0–0.2)

## 2020-12-14 LAB — RESP PANEL BY RT-PCR (FLU A&B, COVID) ARPGX2
Influenza A by PCR: NEGATIVE
Influenza B by PCR: NEGATIVE
SARS Coronavirus 2 by RT PCR: NEGATIVE

## 2020-12-14 NOTE — Discharge Instructions (Signed)
You are seen in the ER for dizziness.  Stroke work-up is negative -MRI of the brain and the blood vessels going to the brain look healthy.  It is unclear why you are having the dizziness.  We recommend that you just follow-up with your primary care doctor in 1 week.  Make sure you hydrate yourself well.

## 2020-12-14 NOTE — ED Triage Notes (Signed)
Pt c/o dizziness around 11:30 this morning and weakness in both arms and both legs. Pt states still having dizziness.

## 2020-12-14 NOTE — ED Notes (Signed)
ED Provider at bedside. 

## 2020-12-14 NOTE — ED Notes (Signed)
Patient transported to MRI 

## 2020-12-14 NOTE — ED Notes (Signed)
Lab at bedside

## 2020-12-14 NOTE — ED Provider Notes (Signed)
[facility] EMERGENCY DEPARTMENT Provider Note   CSN: [id number] Arrival date & time: 12/14/20  1531     History Chief Complaint  Patient presents with   Dizziness    Brandy Lamb is a 80 y.o. female.  HPI     80 year old female comes in with chief complaint of dizziness. Patient has history of hyperlipidemia. She reports that around 1130, suddenly she started having dizziness. Dizziness is described as feeling unsteady, difficult to focus and feeling that she might pass out. States that she has not felt her percent back to normal. She continues to feel wobbly when she walks. She is also had some weakness in both arms and legs.  No history of stroke. No visual disturbance. No recent trauma.  Past Medical History:  Diagnosis Date   Colon polyps    Diverticulosis    GERD (gastroesophageal reflux disease)    Heart murmur    Hypercholesteremia    Hyperlipidemia     Patient Active Problem List   Diagnosis Date Noted   Personal history of colonic polyps 08/22/2011   GERD (gastroesophageal reflux disease) 08/22/2011   URI (upper respiratory infection) 08/22/2011   Abdominal pain, generalized 08/22/2011   Abdominal bloating 08/22/2011   FH: colon cancer 08/22/2011    Class: Question of    Past Surgical History:  Procedure Laterality Date   ABDOMINAL HYSTERECTOMY     CHOLECYSTECTOMY     COLONOSCOPY     polyps, Dr. Aleene Davidson. ?5-6 years ago.   COLONOSCOPY  09/19/2011   Procedure: COLONOSCOPY;  Surgeon: Corbin Ade, MD;  Location: [facility];  Service: Endoscopy;  Laterality: N/A;  7:30   KNEE SURGERY     left   PARTIAL HYSTERECTOMY     TOE SURGERY     2nd toe right foot     OB History   No obstetric history on file.     Family History  Problem Relation Age of Onset   GI problems Father        diverticulitis surgery, not sure but she thinks he had colon cancer   Heart disease Mother 37       deceased   Liver disease Neg Hx     Anesthesia problems Neg Hx    Hypotension Neg Hx    Malignant hyperthermia Neg Hx    Pseudochol deficiency Neg Hx     Social History   Tobacco Use   Smoking status: Former Smoker    Packs/day: 1.00    Years: 10.00    Pack years: 10.00    Types: Cigarettes    Quit date: 09/18/2005    Years since quitting: 15.2   Tobacco comment: quit about 20+ yrs  Substance Use Topics   Alcohol use: Yes    Comment: once in a blue moon   Drug use: No    Home Medications Prior to Admission medications   Medication Sig Start Date End Date Taking? Authorizing Provider  Calcium Carbonate-Vit D-Min (CALCIUM 1200 PO) Take 1 tablet by mouth daily.      [provider]  cholecalciferol (VITAMIN D) 1000 UNITS tablet Take 1,000 Units by mouth daily.      [provider]  Coenzyme Q10 (CO Q-10) 100 MG CAPS Take 1 capsule by mouth daily.      [provider]  fish oil-omega-3 fatty acids 1000 MG capsule Take 1 g by mouth daily.     [provider]  fluticasone (FLONASE) 50 MCG/ACT nasal spray Place  2 sprays into both nostrils daily for 14 days. 01/29/20 02/12/20  Shaune Pollack, MD  Garlic 1000 MG CAPS Take 1 capsule by mouth daily.      [provider]  lansoprazole (PREVACID) 15 MG capsule Take 15 mg by mouth daily.      [provider]  meloxicam (MOBIC) 15 MG tablet Take 1 tablet (15 mg total) by mouth daily. 10/18/15   Lenn Sink, DPM  Multiple Vitamin (ANTIOXIDANT A/C/E PO) Take 1 tablet by mouth daily.      [provider]  rosuvastatin (CRESTOR) 5 MG tablet Take 5 mg by mouth every other day.    [provider]  vitamin B-12 (CYANOCOBALAMIN) 1000 MCG tablet Take 1,000 mcg by mouth daily.      [provider]    Allergies    Levaquin [levofloxacin in d5w]  Review of Systems   Review of Systems  Constitutional: Positive for activity change.  Eyes: Negative for visual disturbance.  Respiratory:  Negative for shortness of breath.   Cardiovascular: Negative for chest pain.  Gastrointestinal: Negative for nausea.  Neurological: Positive for dizziness.    Physical Exam Updated Vital Signs BP 105/60    Pulse 62    Temp 98.3 F (36.8 C) (Oral)    Resp 18    Ht 5' (1.524 m)    Wt 68 kg    SpO2 91%    BMI 29.29 kg/m   Physical Exam Vitals and nursing note reviewed.  Constitutional:      Appearance: She is well-developed.  HENT:     Head: Normocephalic and atraumatic.  Eyes:     Extraocular Movements: Extraocular movements intact and EOM normal.     Pupils: Pupils are equal, round, and reactive to light.  Cardiovascular:     Rate and Rhythm: Normal rate.  Pulmonary:     Effort: Pulmonary effort is normal.  Abdominal:     General: Bowel sounds are normal.  Musculoskeletal:     Cervical back: Normal range of motion and neck supple.  Skin:    General: Skin is warm and dry.  Neurological:     Mental Status: She is alert and oriented to person, place, and time.     Cranial Nerves: No cranial nerve deficit.     Sensory: No sensory deficit.     ED Results / Procedures / Treatments   Labs (all labs ordered are listed, but only abnormal results are displayed) Labs Reviewed  CBC - Abnormal; Notable for the following components:      Result Value   MCV 100.2 (*)    All other components within normal limits  RESP PANEL BY RT-PCR (FLU A&B, COVID) [id number]  BASIC METABOLIC PANEL    EKG EKG Interpretation  Date/Time:  Wednesday December 14 2020 15:53:37 EST Ventricular Rate:  69 PR Interval:  166 QRS Duration: 68 QT Interval:  366 QTC Calculation: 392 R Axis:   49 Text Interpretation: Normal sinus rhythm Normal ECG No acute changes No significant change since last tracing Confirmed by Derwood Kaplan [phone]) on 12/14/2020 4:19:56 PM   Radiology CT Head Wo Contrast  Result Date: 12/14/2020 CLINICAL DATA:  Dizziness, left eye blurred vision EXAM: CT HEAD WITHOUT CONTRAST  TECHNIQUE: Contiguous axial images were obtained from the base of the skull through the vertex without intravenous contrast. COMPARISON:  MRI 01/01/2020, CT 04/21/2010 FINDINGS: Brain: No evidence of acute infarction, hemorrhage, hydrocephalus, extra-axial collection, visible mass lesion or mass effect. Symmetric prominence of  the ventricles, cisterns and sulci compatible with parenchymal volume loss. Patchy areas of white matter hypoattenuation are most compatible with chronic microvascular angiopathy. Vascular: Atherosclerotic calcification of the carotid siphons and intradural vertebral arteries. No hyperdense vessel. Skull: No calvarial fracture or suspicious osseous lesion. No scalp swelling or hematoma. Sinuses/Orbits: Paranasal sinuses and mastoid air cells are predominantly clear. Included orbital structures are unremarkable. Other: None IMPRESSION: 1. No acute intracranial findings. 2. Mild parenchymal volume loss and chronic microvascular angiopathy. Electronically Signed   By: Kreg ShropshirePrice  DeHay M.D.   On: 12/14/2020 16:54   MR ANGIO HEAD WO CONTRAST  Result Date: 12/14/2020 CLINICAL DATA:  Initial evaluation for acute onset dizziness, ataxia, extremity weakness. EXAM: MRI HEAD WITHOUT CONTRAST MRA HEAD WITHOUT CONTRAST TECHNIQUE: Multiplanar, multiecho pulse sequences of the brain and surrounding structures were obtained without intravenous contrast. Angiographic images of the head were obtained using MRA technique without contrast. COMPARISON:  Prior CT from earlier the same day as well as previous MRI from 01/29/2020 FINDINGS: MRI HEAD FINDINGS Brain: Mild age-related cerebral atrophy. Patchy T2/FLAIR hyperintensity within the periventricular and deep white matter both cerebral hemispheres noted, nonspecific, but most like related chronic microvascular ischemic disease, overall mild for age. Mild patchy involvement of the pons noted. No abnormal foci of restricted diffusion to suggest acute or subacute  ischemia. Gray-white matter differentiation maintained. No encephalomalacia to suggest chronic cortical infarction. No evidence for acute or chronic intracranial hemorrhage. No mass lesion, midline shift or mass effect. No hydrocephalus or extra-axial fluid collection. Pituitary gland suprasellar region within normal limits. Midline structures intact and normal. Vascular: Major intracranial vascular flow voids are well maintained. Skull and upper cervical spine: Mild degenerative thickening noted about the tectorial membrane without significant stenosis. Craniocervical junction otherwise unremarkable. Bone marrow signal intensity within normal limits. No scalp soft tissue abnormality. Sinuses/Orbits: Globes and orbital soft tissues within normal limits. Paranasal sinuses are clear. No significant mastoid effusion. Inner ear structures grossly normal. Other: None. MRA HEAD FINDINGS ANTERIOR CIRCULATION: Visualized distal cervical segments of the internal carotid arteries are patent with antegrade flow. Distal cervical right ICA mildly ectatic and tortuous. Petrous, cavernous, and supraclinoid segments patent without stenosis or other abnormality. A1 segments patent bilaterally. Left A1 hypoplastic, accounting for the slightly diminutive left ICA is compared to the right. Normal anterior communicating artery complex. Anterior cerebral arteries widely patent to their distal aspects. No M1 stenosis or occlusion. Normal MCA bifurcations. Distal MCA branches well perfused and symmetric. POSTERIOR CIRCULATION: Both vertebral arteries widely patent to the vertebrobasilar junction. Right vertebral dominant. Neither PICA of visualized. Basilar patent to its distal aspect without stenosis. Superior cerebellar arteries patent bilaterally. Right PCA supplied via the basilar as well as a small right posterior communicating artery. Fetal type origin of the left PCA. Both PCAs well perfused to their distal aspects without stenosis.  No intracranial aneurysm or other vascular abnormality. IMPRESSION: MRI HEAD IMPRESSION: 1. No acute intracranial abnormality. 2. Mild age-related cerebral atrophy with chronic microvascular ischemic disease. MRA HEAD IMPRESSION: Negative intracranial MRA. No large vessel occlusion, hemodynamically significant stenosis, or other acute vascular abnormality. No aneurysm. Electronically Signed   By: Rise MuBenjamin  McClintock M.D.   On: 12/14/2020 18:47   MR BRAIN WO CONTRAST  Result Date: 12/14/2020 CLINICAL DATA:  Initial evaluation for acute onset dizziness, ataxia, extremity weakness. EXAM: MRI HEAD WITHOUT CONTRAST MRA HEAD WITHOUT CONTRAST TECHNIQUE: Multiplanar, multiecho pulse sequences of the brain and surrounding structures were obtained without intravenous contrast. Angiographic images of the head  were obtained using MRA technique without contrast. COMPARISON:  Prior CT from earlier the same day as well as previous MRI from 01/29/2020 FINDINGS: MRI HEAD FINDINGS Brain: Mild age-related cerebral atrophy. Patchy T2/FLAIR hyperintensity within the periventricular and deep white matter both cerebral hemispheres noted, nonspecific, but most like related chronic microvascular ischemic disease, overall mild for age. Mild patchy involvement of the pons noted. No abnormal foci of restricted diffusion to suggest acute or subacute ischemia. Gray-white matter differentiation maintained. No encephalomalacia to suggest chronic cortical infarction. No evidence for acute or chronic intracranial hemorrhage. No mass lesion, midline shift or mass effect. No hydrocephalus or extra-axial fluid collection. Pituitary gland suprasellar region within normal limits. Midline structures intact and normal. Vascular: Major intracranial vascular flow voids are well maintained. Skull and upper cervical spine: Mild degenerative thickening noted about the tectorial membrane without significant stenosis. Craniocervical junction otherwise  unremarkable. Bone marrow signal intensity within normal limits. No scalp soft tissue abnormality. Sinuses/Orbits: Globes and orbital soft tissues within normal limits. Paranasal sinuses are clear. No significant mastoid effusion. Inner ear structures grossly normal. Other: None. MRA HEAD FINDINGS ANTERIOR CIRCULATION: Visualized distal cervical segments of the internal carotid arteries are patent with antegrade flow. Distal cervical right ICA mildly ectatic and tortuous. Petrous, cavernous, and supraclinoid segments patent without stenosis or other abnormality. A1 segments patent bilaterally. Left A1 hypoplastic, accounting for the slightly diminutive left ICA is compared to the right. Normal anterior communicating artery complex. Anterior cerebral arteries widely patent to their distal aspects. No M1 stenosis or occlusion. Normal MCA bifurcations. Distal MCA branches well perfused and symmetric. POSTERIOR CIRCULATION: Both vertebral arteries widely patent to the vertebrobasilar junction. Right vertebral dominant. Neither PICA of visualized. Basilar patent to its distal aspect without stenosis. Superior cerebellar arteries patent bilaterally. Right PCA supplied via the basilar as well as a small right posterior communicating artery. Fetal type origin of the left PCA. Both PCAs well perfused to their distal aspects without stenosis. No intracranial aneurysm or other vascular abnormality. IMPRESSION: MRI HEAD IMPRESSION: 1. No acute intracranial abnormality. 2. Mild age-related cerebral atrophy with chronic microvascular ischemic disease. MRA HEAD IMPRESSION: Negative intracranial MRA. No large vessel occlusion, hemodynamically significant stenosis, or other acute vascular abnormality. No aneurysm. Electronically Signed   By: Rise Mu M.D.   On: 12/14/2020 18:47    Procedures Procedures   Medications Ordered in ED Medications - No data to display  ED Course  I have reviewed the triage vital  signs and the nursing notes.  Pertinent labs & imaging results that were available during my care of the patient were reviewed by me and considered in my medical decision making (see chart for details).    MDM Rules/Calculators/A&P                          80 year old comes in w/ chief complaint of dizziness. No other neuro deficits and patient is have any focal neuro findings on exam, besides slightly ataxic gait.  NIH stroke scale is 0.  Patient was last normal before 1130, 5 hours before I saw her.  Although stroke is in our differential diagnosis, her symptoms are mild enough that I do not think we need to activate code stroke at this time.  CT head ordered.  I have requested the charge nurse to accommodate this patient as soon as possible in terms of providing her a bed.   Reassessment: The patient appears reasonably screened and/or stabilized for  discharge and I doubt any other medical condition or other [facility] requiring further screening, evaluation, or treatment in the ED at this time prior to discharge.   Results from the ER workup discussed with the patient face to face and all questions answered to the best of my ability. The patient is safe for discharge with strict return precautions.  Final Clinical Impression(s) / ED Diagnoses Final diagnoses:  Dizziness    Rx / DC Orders ED Discharge Orders    None       Derwood Kaplan, MD 12/16/20 1645

## 2020-12-14 NOTE — ED Notes (Signed)
EDP to triage to assess pt  

## 2020-12-19 ENCOUNTER — Ambulatory Visit (HOSPITAL_COMMUNITY)
Admission: RE | Admit: 2020-12-19 | Discharge: 2020-12-19 | Disposition: A | Discharge status: Home / Self Care | Payer: Medicare Other | Source: Ambulatory Visit | Attending: Internal Medicine | Admitting: Internal Medicine

## 2020-12-19 ENCOUNTER — Other Ambulatory Visit: Payer: Self-pay

## 2020-12-19 DIAGNOSIS — Z1231 Encounter for screening mammogram for malignant neoplasm of breast: Secondary | ICD-10-CM | POA: Diagnosis not present

## 2021-01-04 DIAGNOSIS — R413 Other amnesia: Secondary | ICD-10-CM | POA: Diagnosis not present

## 2021-03-02 DIAGNOSIS — J019 Acute sinusitis, unspecified: Secondary | ICD-10-CM | POA: Diagnosis not present

## 2021-03-10 DIAGNOSIS — J309 Allergic rhinitis, unspecified: Secondary | ICD-10-CM | POA: Diagnosis not present

## 2021-04-08 DIAGNOSIS — Z20822 Contact with and (suspected) exposure to covid-19: Secondary | ICD-10-CM | POA: Diagnosis not present

## 2021-04-27 DIAGNOSIS — M542 Cervicalgia: Secondary | ICD-10-CM | POA: Diagnosis not present

## 2021-05-03 DIAGNOSIS — M542 Cervicalgia: Secondary | ICD-10-CM | POA: Diagnosis not present

## 2021-08-18 DIAGNOSIS — Z23 Encounter for immunization: Secondary | ICD-10-CM | POA: Diagnosis not present

## 2021-10-03 DIAGNOSIS — J0140 Acute pansinusitis, unspecified: Secondary | ICD-10-CM | POA: Diagnosis not present

## 2021-10-05 DIAGNOSIS — H2513 Age-related nuclear cataract, bilateral: Secondary | ICD-10-CM | POA: Diagnosis not present

## 2021-10-17 DIAGNOSIS — G44209 Tension-type headache, unspecified, not intractable: Secondary | ICD-10-CM | POA: Diagnosis not present

## 2022-02-12 ENCOUNTER — Other Ambulatory Visit (HOSPITAL_COMMUNITY): Payer: Self-pay | Admitting: Internal Medicine

## 2022-02-12 DIAGNOSIS — Z1231 Encounter for screening mammogram for malignant neoplasm of breast: Secondary | ICD-10-CM

## 2022-02-19 ENCOUNTER — Ambulatory Visit (HOSPITAL_COMMUNITY)
Admission: RE | Admit: 2022-02-19 | Discharge: 2022-02-19 | Disposition: A | Discharge status: Home / Self Care | Payer: Medicare Other | Source: Ambulatory Visit | Attending: Internal Medicine | Admitting: Internal Medicine

## 2022-02-19 DIAGNOSIS — Z1231 Encounter for screening mammogram for malignant neoplasm of breast: Secondary | ICD-10-CM | POA: Diagnosis present

## 2023-07-15 ENCOUNTER — Ambulatory Visit: Payer: Medicare Other | Admitting: Neurology

## 2023-10-14 ENCOUNTER — Ambulatory Visit: Payer: Medicare Other | Admitting: Neurology
